# Patient Record
Sex: Female | Born: 1990 | Race: Black or African American | Hispanic: No | Marital: Single
Health system: Midwestern US, Community
[De-identification: ages and names within clinical notes are randomized; demographics above are authoritative.]

## PROBLEM LIST (undated history)

## (undated) DIAGNOSIS — A749 Chlamydial infection, unspecified: Secondary | ICD-10-CM

---
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
## 2011-02-25 ENCOUNTER — Other Ambulatory Visit: Payer: Self-pay | Admitting: Infectious Diseases

## 2011-02-25 ENCOUNTER — Ambulatory Visit
Admission: RE | Admit: 2011-02-25 | Discharge: 2011-02-25 | Disposition: A | Discharge status: Home / Self Care | Payer: PRIVATE HEALTH INSURANCE | Source: Ambulatory Visit | Attending: Infectious Diseases | Admitting: Infectious Diseases

## 2011-02-25 DIAGNOSIS — R7611 Nonspecific reaction to tuberculin skin test without active tuberculosis: Secondary | ICD-10-CM

## 2011-08-22 ENCOUNTER — Encounter (HOSPITAL_COMMUNITY): Payer: Self-pay | Admitting: Emergency Medicine

## 2011-08-22 ENCOUNTER — Emergency Department (HOSPITAL_COMMUNITY): Payer: PRIVATE HEALTH INSURANCE

## 2011-08-22 ENCOUNTER — Emergency Department (HOSPITAL_COMMUNITY)
Admission: EM | Admit: 2011-08-22 | Discharge: 2011-08-22 | Disposition: A | Discharge status: Home / Self Care | Payer: PRIVATE HEALTH INSURANCE | Attending: Emergency Medicine | Admitting: Emergency Medicine

## 2011-08-22 DIAGNOSIS — S39012A Strain of muscle, fascia and tendon of lower back, initial encounter: Secondary | ICD-10-CM

## 2011-08-22 DIAGNOSIS — Y92410 Unspecified street and highway as the place of occurrence of the external cause: Secondary | ICD-10-CM | POA: Insufficient documentation

## 2011-08-22 DIAGNOSIS — M542 Cervicalgia: Secondary | ICD-10-CM | POA: Insufficient documentation

## 2011-08-22 DIAGNOSIS — R079 Chest pain, unspecified: Secondary | ICD-10-CM | POA: Insufficient documentation

## 2011-08-22 DIAGNOSIS — R51 Headache: Secondary | ICD-10-CM | POA: Insufficient documentation

## 2011-08-22 DIAGNOSIS — M546 Pain in thoracic spine: Secondary | ICD-10-CM | POA: Insufficient documentation

## 2011-08-22 DIAGNOSIS — S20219A Contusion of unspecified front wall of thorax, initial encounter: Secondary | ICD-10-CM | POA: Insufficient documentation

## 2011-08-22 MED ORDER — HYDROCODONE-ACETAMINOPHEN 5-500 MG PO TABS: 1.0000 | ORAL_TABLET | Freq: Four times a day (QID) | ORAL | Status: AC | PRN

## 2011-08-22 MED ORDER — IBUPROFEN 800 MG PO TABS
800.0000 mg | ORAL_TABLET | Freq: Once | ORAL | Status: AC
  Administered 2011-08-22: 800 mg via ORAL
  Filled 2011-08-22: qty 1

## 2011-08-22 MED ORDER — MORPHINE SULFATE 4 MG/ML IJ SOLN: 4.0000 mg | Freq: Once | INTRAMUSCULAR | Status: DC

## 2011-08-22 MED ORDER — CYCLOBENZAPRINE HCL 10 MG PO TABS: 10.0000 mg | ORAL_TABLET | Freq: Three times a day (TID) | ORAL | Status: AC | PRN

## 2011-08-22 MED ORDER — NAPROXEN 500 MG PO TABS: 500.0000 mg | ORAL_TABLET | Freq: Two times a day (BID) | ORAL | Status: AC

## 2011-08-22 MED ORDER — OXYCODONE-ACETAMINOPHEN 5-325 MG PO TABS
2.0000 | ORAL_TABLET | Freq: Once | ORAL | Status: AC
  Administered 2011-08-22: 2 via ORAL
  Filled 2011-08-22: qty 2
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
## 2011-08-22 NOTE — Discharge Instructions (Signed)
Lumbosacral Strain Lumbosacral strain is one of the most common causes of back pain. There are many causes of back pain. Most are not serious conditions. CAUSES  Your backbone (spinal column) is made up of 24 main vertebral bodies, the sacrum, and the coccyx. These are held together by muscles and tough, fibrous tissue (ligaments). Nerve roots pass through the openings between the vertebrae. A sudden move or injury to the back may cause injury to, or pressure on, these nerves. This may result in localized back pain or pain movement (radiation) into the buttocks, down the leg, and into the foot. Sharp, shooting pain from the buttock down the back of the leg (sciatica) is frequently associated with a ruptured (herniated) disk. Pain may be caused by muscle spasm alone. Your caregiver can often find the cause of your pain by the details of your symptoms and an exam. In some cases, you may need tests (such as X-rays). Your caregiver will work with you to decide if any tests are needed based on your specific exam. HOME CARE INSTRUCTIONS   Avoid an underactive lifestyle. Active exercise, as directed by your caregiver, is your greatest weapon against back pain.   Avoid hard physical activities (tennis, racquetball, waterskiing) if you are not in proper physical condition for it. This may aggravate or create problems.   If you have a back problem, avoid sports requiring sudden body movements. Swimming and walking are generally safer activities.   Maintain good posture.   Avoid becoming overweight (obese).   Use bed rest for only the most extreme, sudden (acute) episode. Your caregiver will help you determine how much bed rest is necessary.   For acute conditions, you may put ice on the injured area.   Put ice in a plastic bag.   Place a towel between your skin and the bag.   Leave the ice on for 15 to 20 minutes at a time, every 2 hours, or as needed.   After you are improved and more active, it  may help to apply heat for 30 minutes before activities.  See your caregiver if you are having pain that lasts longer than expected. Your caregiver can advise appropriate exercises or therapy if needed. With conditioning, most back problems can be avoided. SEEK IMMEDIATE MEDICAL CARE IF:   You have numbness, tingling, weakness, or problems with the use of your arms or legs.   You experience severe back pain not relieved with medicines.   There is a change in bowel or bladder control.   You have increasing pain in any area of the body, including your belly (abdomen).   You notice shortness of breath, dizziness, or feel faint.   You feel sick to your stomach (nauseous), are throwing up (vomiting), or become sweaty.   You notice discoloration of your toes or legs, or your feet get very cold.   Your back pain is getting worse.   You have a fever.  MAKE SURE YOU:   Understand these instructions.   Will watch your condition.   Will get help right away if you are not doing well or get worse.  Document Released: 12/11/2004 Document Revised: 02/20/2011 Document Reviewed: 06/02/2008 [facility] Patient Information 2012 [place], [place].  Chest Contusion You have been checked for injuries to your chest. Your caregiver has not found injuries serious enough to require hospitalization. It is common to have bruises and sore muscles after an injury. These tend to feel worse the first 24 hours. You may gradually  develop more stiffness and soreness over the next several hours to several days. This usually feels worse the first morning following your injury. After a few days, you will usually begin to improve. The amount of improvement depends on the amount of damage. Following the accident, if the pain in any area continues to increase or you develop new areas of pain, you should see your primary caregiver or return to the Emergency Department for re-evaluation. HOME CARE INSTRUCTIONS   Put ice on  sore areas every 2 hours for 20 minutes while awake for the next 2 days.   Drink extra fluids. Do not drink alcohol.   Activity as tolerated. Lifting may make pain worse.   Only take over-the-counter or prescription medicines for pain, discomfort, or fever as directed by your caregiver. Do not use aspirin. This may increase bruising or increase bleeding.  SEEK IMMEDIATE MEDICAL CARE IF:   There is a worsening of any of the problems that brought you in for care.   Shortness of breath, dizziness or fainting develop.   You have chest pain, difficulty breathing, or develop pain going down the left arm or up into jaw.   You feel sick to your stomach (nausea), vomiting or sweats.   You have increasing belly (abdominal) discomfort.   There is blood in your urine, stool, or if you vomit blood.   There is pain in either shoulder in an area where a shoulder strap would be.   You have feelings of lightheadedness, or if you should have a fainting episode.   You have numbness, tingling, weakness, or problems with the use of your arms or legs.   Severe headaches not relieved with medications develop.   You have a change in bowel or bladder control.   There is increasing pain in any areas of the body.  If you feel your symptoms are worsening, and you are not able to see your primary caregiver, return to the Emergency Department immediately. MAKE SURE YOU:   Understand these instructions.   Will watch your condition.   Will get help right away if you are not doing well or get worse.  Document Released: 11/26/2000 Document Revised: 02/20/2011 Document Reviewed: 10/20/2007 [facility] Patient Information 2012 [place], [place].
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
## 2011-08-22 NOTE — ED Notes (Signed)
Pt refused IV  
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
## 2014-04-16 ENCOUNTER — Emergency Department (INDEPENDENT_AMBULATORY_CARE_PROVIDER_SITE_OTHER)
Admission: EM | Admit: 2014-04-16 | Discharge: 2014-04-16 | Disposition: A | Discharge status: Home / Self Care | Payer: Self-pay | Source: Home / Self Care | Attending: Family Medicine | Admitting: Family Medicine

## 2014-04-16 ENCOUNTER — Encounter (HOSPITAL_COMMUNITY): Payer: Self-pay | Admitting: *Deleted

## 2014-04-16 ENCOUNTER — Other Ambulatory Visit (HOSPITAL_COMMUNITY)
Admission: RE | Admit: 2014-04-16 | Discharge: 2014-04-16 | Disposition: A | Discharge status: Home / Self Care | Payer: Self-pay | Source: Ambulatory Visit | Attending: Family Medicine | Admitting: Family Medicine

## 2014-04-16 DIAGNOSIS — Z113 Encounter for screening for infections with a predominantly sexual mode of transmission: Secondary | ICD-10-CM | POA: Insufficient documentation

## 2014-04-16 DIAGNOSIS — N760 Acute vaginitis: Secondary | ICD-10-CM

## 2014-04-16 LAB — POCT URINALYSIS DIP (DEVICE)
Bilirubin Urine: NEGATIVE
Glucose, UA: NEGATIVE mg/dL
HGB URINE DIPSTICK: NEGATIVE
Nitrite: POSITIVE — AB
PROTEIN: NEGATIVE mg/dL
SPECIFIC GRAVITY, URINE: 1.02 (ref 1.005–1.030)
UROBILINOGEN UA: 1 mg/dL (ref 0.0–1.0)
pH: 7.5 (ref 5.0–8.0)

## 2014-04-16 LAB — POCT PREGNANCY, URINE: Preg Test, Ur: NEGATIVE

## 2014-04-16 MED ORDER — CEFTRIAXONE SODIUM 250 MG IJ SOLR
250.0000 mg | Freq: Once | INTRAMUSCULAR | Status: AC
  Administered 2014-04-16: 250 mg via INTRAMUSCULAR

## 2014-04-16 MED ORDER — AZITHROMYCIN 250 MG PO TABS
ORAL_TABLET | ORAL | Status: AC
  Filled 2014-04-16: qty 4

## 2014-04-16 MED ORDER — METRONIDAZOLE 500 MG PO TABS: 500.0000 mg | ORAL_TABLET | Freq: Two times a day (BID) | ORAL | Status: DC

## 2014-04-16 MED ORDER — AZITHROMYCIN 250 MG PO TABS
1000.0000 mg | ORAL_TABLET | Freq: Once | ORAL | Status: AC
  Administered 2014-04-16: 1000 mg via ORAL

## 2014-04-16 MED ORDER — LIDOCAINE HCL (PF) 1 % IJ SOLN
INTRAMUSCULAR | Status: AC
Start: 2014-04-16 — End: 2014-04-16
  Filled 2014-04-16: qty 5

## 2014-04-16 MED ORDER — CEFTRIAXONE SODIUM 250 MG IJ SOLR
INTRAMUSCULAR | Status: AC
  Filled 2014-04-16: qty 250

## 2014-04-16 NOTE — Discharge Instructions (Signed)
We will call with positive test results and treat as indicated  °

## 2014-04-16 NOTE — ED Provider Notes (Signed)
CSN: [id number]     Arrival date & time 04/16/14  1701 History   First MD Initiated Contact with Patient 04/16/14 1724     Chief Complaint  Patient presents with  . Vaginal Discharge   (Consider location/radiation/quality/duration/timing/severity/associated sxs/prior Treatment) Patient is a 24 y.o. female presenting with vaginal discharge. The history is provided by the patient.  Vaginal Discharge Quality:  Yellow and thin Severity:  Moderate Onset quality:  Gradual Duration:  1 month Progression:  Unchanged Chronicity:  New Context comment:  Last intercourse was 12/25., no birth control. Associated symptoms: no fever     History reviewed. No pertinent past medical history. History reviewed. No pertinent past surgical history. History reviewed. No pertinent family history. History  Substance Use Topics  . Smoking status: Not on file  . Smokeless tobacco: Not on file  . Alcohol Use: No   OB History    No data available     Review of Systems  Constitutional: Negative.  Negative for fever.  Gastrointestinal: Negative.   Genitourinary: Positive for vaginal discharge and menstrual problem. Negative for vaginal bleeding, genital sores and pelvic pain.    Allergies  Review of patient's allergies indicates no known allergies.  Home Medications   Prior to Admission medications   Medication Sig Start Date End Date Taking? Authorizing Provider  ibuprofen (ADVIL,MOTRIN) 200 MG tablet Take 400 mg by mouth every 8 (eight) hours as needed. For pain.    Historical Provider, MD  metroNIDAZOLE (FLAGYL) 500 MG tablet Take 1 tablet (500 mg total) by mouth 2 (two) times daily. 04/16/14   Linna HoffJames D Garrit Marrow, MD   BP 121/75 mmHg  Pulse 84  Temp(Src) 99.9 F (37.7 C) (Oral)  Resp 16  SpO2 100%  LMP 03/31/2014 Physical Exam  Constitutional: She is oriented to person, place, and time. She appears well-developed and well-nourished.  Genitourinary: Uterus normal. Uterus is not enlarged and  not tender. Cervix exhibits discharge. Cervix exhibits no motion tenderness. Right adnexum displays no mass and no tenderness. Left adnexum displays no mass and no tenderness. No erythema or tenderness in the vagina. Vaginal discharge found.  Neurological: She is alert and oriented to person, place, and time.  Skin: Skin is warm and dry.  Nursing note and vitals reviewed.   ED Course  Procedures (including critical care time) Labs Review Labs Reviewed  POCT URINALYSIS DIP (DEVICE) - Abnormal; Notable for the following:    Ketones, ur TRACE (*)    Nitrite POSITIVE (*)    Leukocytes, UA LARGE (*)    All other components within normal limits  HIV ANTIBODY (ROUTINE TESTING)  POCT PREGNANCY, URINE  CERVICOVAGINAL ANCILLARY ONLY    Imaging Review No results found.   MDM   1. Vaginitis        Linna HoffJames D Grabiel Schmutz, MD 04/16/14 1754

## 2014-04-16 NOTE — ED Notes (Signed)
Pt  Has  Symptoms  Of  Vaginal  Discharge      For  Over  1  Month   She  Ambulates upright  With a  Steady  Fluid  Gait     She  Appears  In no  Acute  Distress       Speaking in  Complete  sentances      Skin is  Warm  And  Dry

## 2014-04-17 LAB — CERVICOVAGINAL ANCILLARY ONLY
CHLAMYDIA, DNA PROBE: POSITIVE — AB
NEISSERIA GONORRHEA: NEGATIVE
Wet Prep (BD Affirm): NEGATIVE
Wet Prep (BD Affirm): POSITIVE — AB
Wet Prep (BD Affirm): POSITIVE — AB

## 2014-04-18 ENCOUNTER — Telehealth (HOSPITAL_COMMUNITY): Payer: Self-pay | Admitting: *Deleted

## 2014-04-18 LAB — HIV ANTIBODY (ROUTINE TESTING W REFLEX): HIV Screen 4th Generation wRfx: NONREACTIVE

## 2014-04-18 NOTE — ED Notes (Addendum)
GC neg., Chlamydia pos., Affirm: Candida neg., Gardnerella and Trich pos., HIV pending.  Pt. adequately treated with Zithromax and Flagyl.  I called pt. and left a message to call.  Call 1. 04/18/2014 HIV non-reactive.  Pt. called back today while I was in staffing.  Registration told her I would call back later.  I called pt. Pt. verified x 2 and given results.  Pt. told she was adequately treated for Chlamydia with the Zithromax and the bacterial vaginosis and Trich with the Flagyl.  Pt. instructed to notify her partner to get tested and treated for Chlamydia and Trich.  No sex until she has finished her medicine and her partner has been treated and to practice safe sex.  Pt. [facility].  Pt. said she could see the results on My Chart and thanked me for explaining everything to her. Vassie MoselleYork, Szymon Foiles M 04/19/2014

## 2014-05-27 ENCOUNTER — Encounter (HOSPITAL_COMMUNITY): Payer: Self-pay | Admitting: Emergency Medicine

## 2014-05-27 ENCOUNTER — Emergency Department (INDEPENDENT_AMBULATORY_CARE_PROVIDER_SITE_OTHER)
Admission: EM | Admit: 2014-05-27 | Discharge: 2014-05-27 | Disposition: A | Discharge status: Home / Self Care | Payer: Self-pay | Source: Home / Self Care | Attending: Family Medicine | Admitting: Family Medicine

## 2014-05-27 ENCOUNTER — Other Ambulatory Visit (HOSPITAL_COMMUNITY)
Admission: RE | Admit: 2014-05-27 | Discharge: 2014-05-27 | Disposition: A | Discharge status: Home / Self Care | Payer: Self-pay | Source: Ambulatory Visit | Attending: Family Medicine | Admitting: Family Medicine

## 2014-05-27 DIAGNOSIS — N760 Acute vaginitis: Secondary | ICD-10-CM

## 2014-05-27 DIAGNOSIS — Z113 Encounter for screening for infections with a predominantly sexual mode of transmission: Secondary | ICD-10-CM | POA: Insufficient documentation

## 2014-05-27 LAB — POCT PREGNANCY, URINE: Preg Test, Ur: NEGATIVE

## 2014-05-27 LAB — POCT URINALYSIS DIP (DEVICE)
BILIRUBIN URINE: NEGATIVE
Glucose, UA: NEGATIVE mg/dL
HGB URINE DIPSTICK: NEGATIVE
Ketones, ur: NEGATIVE mg/dL
Nitrite: NEGATIVE
PROTEIN: 30 mg/dL — AB
Specific Gravity, Urine: 1.015 (ref 1.005–1.030)
Urobilinogen, UA: 1 mg/dL (ref 0.0–1.0)
pH: 8.5 — ABNORMAL HIGH (ref 5.0–8.0)

## 2014-05-27 MED ORDER — CEFTRIAXONE SODIUM 250 MG IJ SOLR
250.0000 mg | Freq: Once | INTRAMUSCULAR | Status: AC
  Administered 2014-05-27: 250 mg via INTRAMUSCULAR

## 2014-05-27 MED ORDER — LIDOCAINE HCL (PF) 1 % IJ SOLN
INTRAMUSCULAR | Status: AC
  Filled 2014-05-27: qty 5

## 2014-05-27 MED ORDER — CEFTRIAXONE SODIUM 250 MG IJ SOLR
INTRAMUSCULAR | Status: AC
  Filled 2014-05-27: qty 250

## 2014-05-27 MED ORDER — AZITHROMYCIN 250 MG PO TABS
ORAL_TABLET | ORAL | Status: AC
  Filled 2014-05-27: qty 4

## 2014-05-27 MED ORDER — AZITHROMYCIN 250 MG PO TABS
1000.0000 mg | ORAL_TABLET | Freq: Once | ORAL | Status: AC
  Administered 2014-05-27: 1000 mg via ORAL

## 2014-05-27 NOTE — Discharge Instructions (Signed)
No sex x 2 weeks. Please use condoms. If no improvement, please follow up at health dept or obgyn provider of your choice. Sexually Transmitted Disease A sexually transmitted disease (STD) is a disease or infection that may be passed (transmitted) from person to person, usually during sexual activity. This may happen by way of saliva, semen, blood, vaginal mucus, or urine. Common STDs include:   Gonorrhea.   Chlamydia.   Syphilis.   HIV and AIDS.   Genital herpes.   Hepatitis B and C.   Trichomonas.   Human papillomavirus (HPV).   Pubic lice.   Scabies.  Mites.  Bacterial vaginosis. WHAT ARE CAUSES OF STDs? An STD may be caused by bacteria, a virus, or parasites. STDs are often transmitted during sexual activity if one person is infected. However, they may also be transmitted through nonsexual means. STDs may be transmitted after:   Sexual intercourse with an infected person.   Sharing sex toys with an infected person.   Sharing needles with an infected person or using unclean piercing or tattoo needles.  Having intimate contact with the genitals, mouth, or rectal areas of an infected person.   Exposure to infected fluids during birth. WHAT ARE THE SIGNS AND SYMPTOMS OF STDs? Different STDs have different symptoms. Some people may not have any symptoms. If symptoms are present, they may include:   Painful or bloody urination.   Pain in the pelvis, abdomen, vagina, anus, throat, or eyes.   A skin rash, itching, or irritation.  Growths, ulcerations, blisters, or sores in the genital and anal areas.  Abnormal vaginal discharge with or without bad odor.   Penile discharge in men.   Fever.   Pain or bleeding during sexual intercourse.   Swollen glands in the groin area.   Yellow skin and eyes (jaundice). This is seen with hepatitis.   Swollen testicles.  Infertility.  Sores and blisters in the mouth. HOW ARE STDs DIAGNOSED? To make  a diagnosis, your health care provider may:   Take a medical history.   Perform a physical exam.   Take a sample of any discharge to examine.  Swab the throat, cervix, opening to the penis, rectum, or vagina for testing.  Test a sample of your first morning urine.   Perform blood tests.   Perform a Pap test, if this applies.   Perform a colposcopy.   Perform a laparoscopy.  HOW ARE STDs TREATED? Treatment depends on the STD. Some STDs may be treated but not cured.   Chlamydia, gonorrhea, trichomonas, and syphilis can be cured with antibiotic medicine.   Genital herpes, hepatitis, and HIV can be treated, but not cured, with prescribed medicines. The medicines lessen symptoms.   Genital warts from HPV can be treated with medicine or by freezing, burning (electrocautery), or surgery. Warts may come back.   HPV cannot be cured with medicine or surgery. However, abnormal areas may be removed from the cervix, vagina, or vulva.   If your diagnosis is confirmed, your recent sexual partners need treatment. This is true even if they are symptom-free or have a negative culture or evaluation. They should not have sex until their health care providers say it is okay. HOW CAN I REDUCE MY RISK OF GETTING AN STD? Take these steps to reduce your risk of getting an STD:  Use latex condoms, dental dams, and water-soluble lubricants during sexual activity. Do not use petroleum jelly or oils.  Avoid having multiple sex partners.  Do not have  sex with someone who has other sex partners.  Do not have sex with anyone you do not know or who is at high risk for an STD.  Avoid risky sex practices that can break your skin.  Do not have sex if you have open sores on your mouth or skin.  Avoid drinking too much alcohol or taking illegal drugs. Alcohol and drugs can affect your judgment and put you in a vulnerable position.  Avoid engaging in oral and anal sex acts.  Get vaccinated for  HPV and hepatitis. If you have not received these vaccines in the past, talk to your health care provider about whether one or both might be right for you.   If you are at risk of being infected with HIV, it is recommended that you take a prescription medicine daily to prevent HIV infection. This is called pre-exposure prophylaxis (PrEP). You are considered at risk if:  You are a man who has sex with other men (MSM).  You are a heterosexual man or woman and are sexually active with more than one partner.  You take drugs by injection.  You are sexually active with a partner who has HIV.  Talk with your health care provider about whether you are at high risk of being infected with HIV. If you choose to begin PrEP, you should first be tested for HIV. You should then be tested every 3 months for as long as you are taking PrEP.  WHAT SHOULD I DO IF I THINK I HAVE AN STD?  See your health care provider.   Tell your sexual partner(s). They should be tested and treated for any STDs.  Do not have sex until your health care provider says it is okay. WHEN SHOULD I GET IMMEDIATE MEDICAL CARE? Contact your health care provider right away if:   You have severe abdominal pain.  You are a man and notice swelling or pain in your testicles.  You are a woman and notice swelling or pain in your vagina. Document Released: 05/24/2002 Document Revised: 03/08/2013 Document Reviewed: 09/21/2012 [facility] Patient Information 2015 [place], [place]. This information is not intended to replace advice given to you by your health care provider. Make sure you discuss any questions you have with your health care provider.  Vaginitis Vaginitis is an inflammation of the vagina. It is most often caused by a change in the normal balance of the bacteria and yeast that live in the vagina. This change in balance causes an overgrowth of certain bacteria or yeast, which causes the inflammation. There are different types of  vaginitis, but the most common types are:  Bacterial vaginosis.  Yeast infection (candidiasis).  Trichomoniasis vaginitis. This is a sexually transmitted infection (STI).  Viral vaginitis.  Atropic vaginitis.  Allergic vaginitis. CAUSES  The cause depends on the type of vaginitis. Vaginitis can be caused by:  Bacteria (bacterial vaginosis).  Yeast (yeast infection).  A parasite (trichomoniasis vaginitis)  A virus (viral vaginitis).  Low hormone levels (atrophic vaginitis). Low hormone levels can occur during pregnancy, breastfeeding, or after menopause.  Irritants, such as bubble baths, scented tampons, and feminine sprays (allergic vaginitis). Other factors can change the normal balance of the yeast and bacteria that live in the vagina. These include:  Antibiotic medicines.  Poor hygiene.  Diaphragms, vaginal sponges, spermicides, birth control pills, and intrauterine devices (IUD).  Sexual intercourse.  Infection.  Uncontrolled diabetes.  A weakened immune system. SYMPTOMS  Symptoms can vary depending on the cause of  the vaginitis. Common symptoms include:  Abnormal vaginal discharge.  The discharge is white, gray, or yellow with bacterial vaginosis.  The discharge is thick, white, and cheesy with a yeast infection.  The discharge is frothy and yellow or greenish with trichomoniasis.  A bad vaginal odor.  The odor is fishy with bacterial vaginosis.  Vaginal itching, pain, or swelling.  Painful intercourse.  Pain or burning when urinating. Sometimes, there are no symptoms. TREATMENT  Treatment will vary depending on the type of infection.   Bacterial vaginosis and trichomoniasis are often treated with antibiotic creams or pills.  Yeast infections are often treated with antifungal medicines, such as vaginal creams or suppositories.  Viral vaginitis has no cure, but symptoms can be treated with medicines that relieve discomfort. Your sexual partner  should be treated as well.  Atrophic vaginitis may be treated with an estrogen cream, pill, suppository, or vaginal ring. If vaginal dryness occurs, lubricants and moisturizing creams may help. You may be told to avoid scented soaps, sprays, or douches.  Allergic vaginitis treatment involves quitting the use of the product that is causing the problem. Vaginal creams can be used to treat the symptoms. HOME CARE INSTRUCTIONS   Take all medicines as directed by your caregiver.  Keep your genital area clean and dry. Avoid soap and only rinse the area with water.  Avoid douching. It can remove the healthy bacteria in the vagina.  Do not use tampons or have sexual intercourse until your vaginitis has been treated. Use sanitary pads while you have vaginitis.  Wipe from front to back. This avoids the spread of bacteria from the rectum to the vagina.  Let air reach your genital area.  Wear cotton underwear to decrease moisture buildup.  Avoid wearing underwear while you sleep until your vaginitis is gone.  Avoid tight pants and underwear or nylons without a cotton panel.  Take off wet clothing (especially bathing suits) as soon as possible.  Use mild, non-scented products. Avoid using irritants, such as:  Scented feminine sprays.  Fabric softeners.  Scented detergents.  Scented tampons.  Scented soaps or bubble baths.  Practice safe sex and use condoms. Condoms may prevent the spread of trichomoniasis and viral vaginitis. SEEK MEDICAL CARE IF:   You have abdominal pain.  You have a fever or persistent symptoms for more than 2-3 days.  You have a fever and your symptoms suddenly get worse. Document Released: 12/29/2006 Document Revised: 11/26/2011 Document Reviewed: 08/14/2011 [facility] Patient Information 2015 [place]. This information is not intended to replace advice given to you by your health care provider. Make sure you discuss any questions you have with your  health care provider.

## 2014-05-27 NOTE — ED Notes (Signed)
C/o vaginal discharge with irritation and mild pelvic/abdominal discomfort.   Discharge is sometimes thick/white and yellow.  Denies fever, n/v/d.

## 2014-05-27 NOTE — ED Provider Notes (Signed)
CSN: [id number]     Arrival date & time 05/27/14  1129 History   First MD Initiated Contact with Patient 05/27/14 1245     Chief Complaint  Patient presents with  . Vaginal Discharge   (Consider location/radiation/quality/duration/timing/severity/associated sxs/prior Treatment) Patient is a 24 y.o. female presenting with vaginal discharge. The history is provided by the patient.  Vaginal Discharge Quality:  Malodorous and green Onset quality:  Gradual Duration:  3 days Timing:  Constant Chronicity:  New Context: after intercourse   Relieved by:  None tried Ineffective treatments:  None tried Risk factors: unprotected sex   Risk factors comment:  Concerned about infidelity and possible STI exposure   History reviewed. No pertinent past medical history. History reviewed. No pertinent past surgical history. History reviewed. No pertinent family history. History  Substance Use Topics  . Smoking status: Never Smoker   . Smokeless tobacco: Not on file  . Alcohol Use: No   OB History    No data available     Review of Systems  Genitourinary: Positive for vaginal discharge.  All other systems reviewed and are negative.   Allergies  Review of patient's allergies indicates no known allergies.  Home Medications   Prior to Admission medications   Medication Sig Start Date End Date Taking? Authorizing Provider  ibuprofen (ADVIL,MOTRIN) 200 MG tablet Take 400 mg by mouth every 8 (eight) hours as needed. For pain.    Historical Provider, MD  metroNIDAZOLE (FLAGYL) 500 MG tablet Take 1 tablet (500 mg total) by mouth 2 (two) times daily. 04/16/14   Linna HoffJames D Kindl, MD   BP 98/69 mmHg  Pulse 76  Temp(Src) 98.5 F (36.9 C) (Oral)  Resp 12  SpO2 99%  LMP 05/25/2014 Physical Exam  Constitutional: She is oriented to person, place, and time. She appears well-developed and well-nourished. No distress.  HENT:  Head: Normocephalic and atraumatic.  Eyes: Conjunctivae are normal. No  scleral icterus.  Cardiovascular: Normal rate.   Pulmonary/Chest: Effort normal.  Genitourinary: Uterus normal. Pelvic exam was performed with patient supine. There is no rash, tenderness or lesion on the right labia. There is no rash, tenderness or lesion on the left labia. Cervix exhibits discharge. Cervix exhibits no motion tenderness and no friability. Right adnexum displays no mass, no tenderness and no fullness. Left adnexum displays no mass, no tenderness and no fullness. No erythema, tenderness or bleeding in the vagina. No foreign body around the vagina. No signs of injury around the vagina. Vaginal discharge found.  Copious yellow green thin vaginal discharge  Musculoskeletal: Normal range of motion.  Neurological: She is alert and oriented to person, place, and time.  Skin: Skin is warm and dry.  Psychiatric: She has a normal mood and affect. Her behavior is normal.  Nursing note and vitals reviewed.   ED Course  Procedures (including critical care time) Labs Review Labs Reviewed  POCT URINALYSIS DIP (DEVICE) - Abnormal; Notable for the following:    pH 8.5 (*)    Protein, ur 30 (*)    Leukocytes, UA LARGE (*)    All other components within normal limits  HIV ANTIBODY (ROUTINE TESTING)  RPR  POCT PREGNANCY, URINE  CERVICOVAGINAL ANCILLARY ONLY    Imaging Review No results found.   MDM   1. Vaginitis   Cervicovaginal and serology testing for STIs pending Patient treated empirically for gonorrhea and chlamydia with ceftriaxone 250 mg IM and [facility] Advised regarding safe sex practices No sex  x 2 weeks Wear condoms Follow up at [facility] if no improvement.    Ria Clock, [place] 05/27/14 1335

## 2014-05-28 LAB — HIV ANTIBODY (ROUTINE TESTING W REFLEX): HIV Screen 4th Generation wRfx: NONREACTIVE

## 2014-05-28 LAB — RPR: RPR: NONREACTIVE

## 2014-05-29 LAB — CERVICOVAGINAL ANCILLARY ONLY
CHLAMYDIA, DNA PROBE: NEGATIVE
NEISSERIA GONORRHEA: NEGATIVE
WET PREP (BD AFFIRM): NEGATIVE
WET PREP (BD AFFIRM): POSITIVE — AB
WET PREP (BD AFFIRM): POSITIVE — AB

## 2014-05-29 MED ORDER — METRONIDAZOLE 500 MG PO TABS: 500.0000 mg | ORAL_TABLET | Freq: Two times a day (BID) | ORAL | Status: DC

## 2014-05-29 NOTE — Progress Notes (Signed)
05/29/2014: Patient's lab results consistent with BV and trichomonas infection. Contacted patient by phone and verified identity and results discussed. 7 day course of Flagyl sent electronically to patient's pharmacy [facility] [address]) and patient made aware of safe sex practices, need to notify partner, and no sex x 2 weeks and only if symptoms resolved. Voices understanding.

## 2014-05-31 ENCOUNTER — Telehealth (HOSPITAL_COMMUNITY): Payer: Self-pay | Admitting: *Deleted

## 2014-05-31 NOTE — ED Notes (Signed)
GC/Chlamydia neg., Affirm: Candida neg., Gardnerella and Trich pos., HIV/RPR non-reactive.  I called pt. Pt. verified x 2 and given results.  Pt. told she was adequately treated with Flagyl.  Instructed to notify her partner to be treated with Flagyl, no sex until she has finished her medication and her partner has been treated and to practice safe sex. Pt. told she can get HIV [facility] by appointment. After talking to pt., I noted that Narda BondsLee Presson PA had already notified her on 3/14.  Pt. did not tell me she had already received her results. 05/31/2014

## 2014-08-06 ENCOUNTER — Encounter (HOSPITAL_COMMUNITY): Payer: Self-pay | Admitting: Emergency Medicine

## 2014-08-06 ENCOUNTER — Emergency Department (HOSPITAL_COMMUNITY)
Admission: EM | Admit: 2014-08-06 | Discharge: 2014-08-06 | Disposition: A | Discharge status: Home / Self Care | Payer: Self-pay | Attending: Emergency Medicine | Admitting: Emergency Medicine

## 2014-08-06 DIAGNOSIS — Z8619 Personal history of other infectious and parasitic diseases: Secondary | ICD-10-CM | POA: Insufficient documentation

## 2014-08-06 DIAGNOSIS — N898 Other specified noninflammatory disorders of vagina: Secondary | ICD-10-CM | POA: Insufficient documentation

## 2014-08-06 DIAGNOSIS — Z3202 Encounter for pregnancy test, result negative: Secondary | ICD-10-CM | POA: Insufficient documentation

## 2014-08-06 HISTORY — DX: Chlamydial infection, unspecified: A74.9

## 2014-08-06 LAB — POC URINE PREG, ED: Preg Test, Ur: NEGATIVE

## 2014-08-06 LAB — URINALYSIS, ROUTINE W REFLEX MICROSCOPIC
Glucose, UA: NEGATIVE mg/dL
Hgb urine dipstick: NEGATIVE
Ketones, ur: 15 mg/dL — AB
Nitrite: NEGATIVE
Protein, ur: NEGATIVE mg/dL
Specific Gravity, Urine: 1.028 (ref 1.005–1.030)
Urobilinogen, UA: 0.2 mg/dL (ref 0.0–1.0)
pH: 5.5 (ref 5.0–8.0)

## 2014-08-06 LAB — URINE MICROSCOPIC-ADD ON

## 2014-08-06 MED ORDER — LIDOCAINE HCL (PF) 1 % IJ SOLN
0.9000 mL | Freq: Once | INTRAMUSCULAR | Status: AC
  Administered 2014-08-06: 0.9 mL via INTRADERMAL
  Filled 2014-08-06: qty 5

## 2014-08-06 MED ORDER — CEFTRIAXONE SODIUM 250 MG IJ SOLR
250.0000 mg | Freq: Once | INTRAMUSCULAR | Status: AC
  Administered 2014-08-06: 250 mg via INTRAMUSCULAR
  Filled 2014-08-06: qty 250

## 2014-08-06 MED ORDER — AZITHROMYCIN 250 MG PO TABS
1000.0000 mg | ORAL_TABLET | Freq: Once | ORAL | Status: AC
  Administered 2014-08-06: 1000 mg via ORAL
  Filled 2014-08-06: qty 4

## 2014-08-06 MED ORDER — ONDANSETRON 4 MG PO TBDP
4.0000 mg | ORAL_TABLET | Freq: Once | ORAL | Status: AC
  Administered 2014-08-06: 4 mg via ORAL
  Filled 2014-08-06: qty 1

## 2014-08-06 NOTE — ED Notes (Signed)
Pt comes in c/o vaginal itching and discharge after sexual intercourse yesterday.  Denies difficulty urinating or burning while urinating.  Denies N/V/D, fevers, chills.

## 2014-08-06 NOTE — ED Notes (Signed)
PA at bedside.

## 2014-08-06 NOTE — ED Notes (Signed)
Patient c/o nausea, feeling hot and sweating. Patient's heart rate has gone up.  Zofran ordered and PA notified.

## 2014-08-06 NOTE — Discharge Instructions (Signed)
Return here as needed. Follow up with the Health Department as needed.

## 2014-08-06 NOTE — ED Provider Notes (Signed)
CSN: [id number]     Arrival date & time 08/06/14  4098 History   First MD Initiated Contact with Patient 08/06/14 [phone]     Chief Complaint  Patient presents with  . Vaginal Itching  . Vaginal Discharge     (Consider location/radiation/quality/duration/timing/severity/associated sxs/prior Treatment) HPI Patient presents to the emergency department with vaginal discharge and itching.  The patient states that she noticed she had discharge after intercourse yesterday.  The patient states she does not have any urinary symptoms.  Patient denies nausea, vomiting, diarrhea, abdominal pain, weakness, dizziness, fever, back pain, incontinence, hematuria, or syncope.  The patient states that she did not take any medications prior to arrival.  She states she has a history of chlamydia Past Medical History  Diagnosis Date  . Chlamydia    History reviewed. No pertinent past surgical history. History reviewed. No pertinent family history. History  Substance Use Topics  . Smoking status: Never Smoker   . Smokeless tobacco: Not on file  . Alcohol Use: No   OB History    No data available     Review of Systems  All other systems negative except as documented in the HPI. All pertinent positives and negatives as reviewed in the HPI.  Allergies  Review of patient's allergies indicates no known allergies.  Home Medications   Prior to Admission medications   Medication Sig Start Date End Date Taking? Authorizing Provider  ibuprofen (ADVIL,MOTRIN) 200 MG tablet Take 400 mg by mouth every 8 (eight) hours as needed. For pain.   Yes Historical Provider, MD  OVER THE COUNTER MEDICATION Take 1 tablet by mouth daily. Sinus & Congestion medication   Yes Historical Provider, MD   BP 118/75 mmHg  Pulse 105  Temp(Src) 98.2 F (36.8 C) (Oral)  Resp 20  SpO2 99%  LMP  (LMP Unknown) Physical Exam  Constitutional: She is oriented to person, place, and time. She appears well-developed and well-nourished.  No distress.  HENT:  Head: Normocephalic and atraumatic.  Mouth/Throat: Oropharynx is clear and moist.  Eyes: Pupils are equal, round, and reactive to light.  Neck: Normal range of motion. Neck supple.  Cardiovascular: Regular rhythm and normal heart sounds.  Exam reveals no gallop and no friction rub.   No murmur heard. Pulmonary/Chest: Effort normal and breath sounds normal.  Genitourinary: Cervix exhibits discharge. Cervix exhibits no motion tenderness and no friability. Right adnexum displays no mass and no tenderness. Left adnexum displays no mass and no tenderness. No erythema, tenderness or bleeding in the vagina. No foreign body around the vagina. No signs of injury around the vagina. Vaginal discharge found.  Chaperone was present during the exam  Neurological: She is alert and oriented to person, place, and time. She exhibits normal muscle tone. Coordination normal.  Skin: Skin is warm and dry. No rash noted. No erythema.  Nursing note and vitals reviewed.   ED Course  Procedures (including critical care time) Labs Review Labs Reviewed  URINALYSIS, ROUTINE W REFLEX MICROSCOPIC - Abnormal; Notable for the following:    APPearance CLOUDY (*)    Bilirubin Urine SMALL (*)    Ketones, ur 15 (*)    Leukocytes, UA SMALL (*)    All other components within normal limits  URINE MICROSCOPIC-ADD ON - Abnormal; Notable for the following:    Squamous Epithelial / LPF MANY (*)    All other components within normal limits  RPR  POC URINE PREG, ED  GC/CHLAMYDIA PROBE AMP ([place])   The  patient will be treated for sexually transmitted diseases.  She is advised follow-up with the health department as needed    Charlestine NightChristopher Ailah Barna, PA-C 08/06/14 1619  Nelva Nayobert Beaton, MD 08/07/14 [phone]

## 2014-08-07 LAB — GC/CHLAMYDIA PROBE AMP (~~LOC~~) NOT AT ARMC
Chlamydia: NEGATIVE
Neisseria Gonorrhea: NEGATIVE

## 2014-08-07 LAB — RPR: RPR Ser Ql: NONREACTIVE

## 2015-03-07 ENCOUNTER — Encounter (HOSPITAL_COMMUNITY): Payer: Self-pay | Admitting: *Deleted

## 2015-03-07 ENCOUNTER — Emergency Department (INDEPENDENT_AMBULATORY_CARE_PROVIDER_SITE_OTHER)
Admission: EM | Admit: 2015-03-07 | Discharge: 2015-03-07 | Disposition: A | Discharge status: Home / Self Care | Payer: Self-pay | Source: Home / Self Care

## 2015-03-07 DIAGNOSIS — R110 Nausea: Secondary | ICD-10-CM

## 2015-03-07 LAB — POCT URINALYSIS DIP (DEVICE)
GLUCOSE, UA: NEGATIVE mg/dL
Hgb urine dipstick: NEGATIVE
Ketones, ur: 80 mg/dL — AB
Leukocytes, UA: NEGATIVE
Nitrite: NEGATIVE
Protein, ur: NEGATIVE mg/dL
SPECIFIC GRAVITY, URINE: 1.025 (ref 1.005–1.030)
UROBILINOGEN UA: 1 mg/dL (ref 0.0–1.0)
pH: 6.5 (ref 5.0–8.0)

## 2015-03-07 LAB — POCT PREGNANCY, URINE: PREG TEST UR: NEGATIVE

## 2015-03-07 MED ORDER — PROMETHAZINE HCL 25 MG PO TABS: 25.0000 mg | ORAL_TABLET | Freq: Four times a day (QID) | ORAL | Status: DC | PRN

## 2015-03-07 NOTE — ED Notes (Signed)
Pt  Reports  Symptoms  Of  Nausea      Irregular periods           Lightheaded          X      4  Weeks             Pt  Appears  In no  Acute     Distress

## 2015-03-07 NOTE — Discharge Instructions (Signed)
Use medicine as needed and see your gynecologist or women's hosp if further menstrual problems.

## 2015-03-07 NOTE — ED Provider Notes (Signed)
CSN: [id number]     Arrival date & time 03/07/15  1657 History   None    Chief Complaint  Patient presents with  . Abdominal Pain   (Consider location/radiation/quality/duration/timing/severity/associated sxs/prior Treatment) Patient is a 24 y.o. female presenting with abdominal pain. The history is provided by the patient.  Abdominal Pain Pain location:  Epigastric Pain quality: fullness   Pain radiates to:  Does not radiate Pain severity:  Mild Onset quality:  Gradual Duration:  3 days Progression:  Unchanged Chronicity:  New Context: not sick contacts and not suspicious food intake   Context comment:  Possible preg as abnl menses without birth control. Associated symptoms: nausea   Associated symptoms: no diarrhea, no dysuria, no fever, no vaginal bleeding, no vaginal discharge and no vomiting     Past Medical History  Diagnosis Date  . Chlamydia    History reviewed. No pertinent past surgical history. History reviewed. No pertinent family history. Social History  Substance Use Topics  . Smoking status: Never Smoker   . Smokeless tobacco: None  . Alcohol Use: No   OB History    No data available     Review of Systems  Constitutional: Negative for fever.  Gastrointestinal: Positive for nausea and abdominal pain. Negative for vomiting and diarrhea.  Genitourinary: Positive for menstrual problem. Negative for dysuria, urgency, frequency, flank pain, vaginal bleeding, vaginal discharge and pelvic pain.  All other systems reviewed and are negative.   Allergies  Review of patient's allergies indicates no known allergies.  Home Medications   Prior to Admission medications   Medication Sig Start Date End Date Taking? Authorizing Provider  ibuprofen (ADVIL,MOTRIN) 200 MG tablet Take 400 mg by mouth every 8 (eight) hours as needed. For pain.    Historical Provider, MD  OVER THE COUNTER MEDICATION Take 1 tablet by mouth daily. Sinus & Congestion medication     Historical Provider, MD  promethazine (PHENERGAN) 25 MG tablet Take 1 tablet (25 mg total) by mouth every 6 (six) hours as needed for nausea or vomiting. 03/07/15   Linna Hoff, MD   Meds Ordered and Administered this Visit  Medications - No data to display  BP 122/62 mmHg  Pulse 78  Temp(Src) 98.6 F (37 C) (Oral)  Resp 18  SpO2 100%  LMP 02/28/2015 No data found.   Physical Exam  Constitutional: She is oriented to person, place, and time. She appears well-developed and well-nourished. No distress.  Abdominal: Soft. Bowel sounds are normal. She exhibits no distension and no mass. There is no hepatosplenomegaly. There is tenderness in the epigastric area. There is no rigidity, no rebound, no guarding and no CVA tenderness.  Neurological: She is alert and oriented to person, place, and time.  Skin: Skin is warm and dry.  Nursing note and vitals reviewed.   ED Course  Procedures (including critical care time)  Labs Review Labs Reviewed  POCT URINALYSIS DIP (DEVICE) - Abnormal; Notable for the following:    Bilirubin Urine SMALL (*)    Ketones, ur 80 (*)    All other components within normal limits  POCT PREGNANCY, URINE   U/a and upreg neg.  Imaging Review No results found.   Visual Acuity Review  Right Eye Distance:   Left Eye Distance:   Bilateral Distance:    Right Eye Near:   Left Eye Near:    Bilateral Near:         MDM   1. Nausea in adult  Linna HoffJames D Kindl, MD 03/07/15 [phone]

## 2016-05-22 ENCOUNTER — Inpatient Hospital Stay
Admit: 2016-05-22 | Discharge: 2016-05-22 | Disposition: A | Discharge status: Home / Self Care | Payer: Self-pay | Attending: Emergency Medicine

## 2016-05-22 ENCOUNTER — Emergency Department: Admit: 2016-05-22 | Payer: Self-pay

## 2016-05-22 DIAGNOSIS — F12929 Cannabis use, unspecified with intoxication, unspecified: Secondary | ICD-10-CM

## 2016-05-22 LAB — EKG, 12 LEAD, INITIAL
Atrial Rate: 103 {beats}/min
Calculated P Axis: 63 degrees
Calculated R Axis: 57 degrees
Calculated T Axis: 41 degrees
P-R Interval: 144 ms
Q-T Interval: 330 ms
QRS Duration: 94 ms
QTC Calculation (Bezet): 432 ms
Ventricular Rate: 103 {beats}/min

## 2016-05-22 LAB — EKG 12-LEAD
Atrial Rate: 103 {beats}/min
P Axis: 63 degrees
P-R Interval: 144 ms
Q-T Interval: 330 ms
QRS Duration: 94 ms
QTc Calculation (Bazett): 432 ms
R Axis: 57 degrees
T Axis: 41 degrees
Ventricular Rate: 103 {beats}/min

## 2016-05-22 NOTE — ED Provider Notes (Signed)
HPI Comments: Pt is a 26 yo female with no sig PMH that presents to the ED via EMS for and episode of SOB that occurred briefly about an hour ago. Pt reports she was smoking a marijuana blunt, approx half of one, and after "taking a hit" she felt "funny, like I couldn't catch my breath". She reports since then the SOB and "funny feeling" has resolved. She denies any other illicit drug use or tobacco use. She reports she has smoked marijuana in the past but it has been a long time. She denies any modifying factors. She denies trying anything for her symptoms. She denies fever, chills, HA, dizziness, lightheadedness, congestion, cough, CP, palpitations, abd pain, N/V/D, numbness, tingling, weakness, or any other complaints at this time.     Patient is a 26 y.o. female presenting with shortness of breath. The history is provided by the patient.   Shortness of Breath   This is a new problem. The average episode lasts 10 minutes. The current episode started less than 1 hour ago. The problem has been resolved. Pertinent negatives include no fever, no headaches, no coryza, no rhinorrhea, no sore throat, no swollen glands, no ear pain, no neck pain, no cough, no sputum production, no hemoptysis, no wheezing, no PND, no orthopnea, no chest pain, no syncope, no vomiting, no abdominal pain, no rash, no leg pain, no leg swelling and no claudication. The problem's precipitants include smoke (Marijuana). She has tried nothing for the symptoms. She has had no prior hospitalizations. She has had no prior ED visits. She has had no prior ICU admissions. Associated medical issues do not include asthma, COPD or PE.        History reviewed. No pertinent past medical history.    History reviewed. No pertinent surgical history.      History reviewed. No pertinent family history.    Social History     Social History   ??? Marital status: SINGLE     Spouse name: N/A   ??? Number of children: N/A   ??? Years of education: N/A      Occupational History   ??? Not on file.     Social History Main Topics   ??? Smoking status: Not on file   ??? Smokeless tobacco: Not on file   ??? Alcohol use Not on file   ??? Drug use: Not on file   ??? Sexual activity: Not on file     Other Topics Concern   ??? Not on file     Social History Narrative   ??? No narrative on file         ALLERGIES: Review of patient's allergies indicates no known allergies.    Review of Systems   Constitutional: Negative for chills and fever.   HENT: Negative for congestion, ear pain, rhinorrhea and sore throat.    Eyes: Negative for pain and redness.   Respiratory: Positive for shortness of breath. Negative for apnea, cough, hemoptysis, sputum production, choking, chest tightness, wheezing and stridor.    Cardiovascular: Negative for chest pain, palpitations, orthopnea, claudication, leg swelling, syncope and PND.   Gastrointestinal: Negative for abdominal pain, constipation, diarrhea, nausea and vomiting.   Genitourinary: Negative for dysuria.   Musculoskeletal: Negative for arthralgias, back pain, gait problem, joint swelling, myalgias, neck pain and neck stiffness.   Skin: Negative.  Negative for rash.   Allergic/Immunologic: Negative for environmental allergies and immunocompromised state.   Neurological: Negative for dizziness, tremors, seizures, syncope, facial asymmetry, speech difficulty,  weakness, light-headedness, numbness and headaches.   Hematological: Negative for adenopathy.   Psychiatric/Behavioral: Negative.    All other systems reviewed and are negative.      Vitals:    05/22/16 1242   BP: 125/81   Pulse: (!) 111   Resp: 16   Temp: 97.6 ??F (36.4 ??C)   SpO2: 98%   Weight: 54.4 kg (120 lb)   Height: 5' (1.524 m)            Physical Exam   Constitutional: She is oriented to person, place, and time. She appears well-developed and well-nourished. She is active and cooperative.  Non-toxic appearance. She does not have a sickly appearance. She does not appear ill. No distress.    HENT:   Head: Normocephalic and atraumatic.   Right Ear: Tympanic membrane, external ear and ear canal normal.   Left Ear: Tympanic membrane, external ear and ear canal normal.   Nose: Nose normal.   Mouth/Throat: Oropharynx is clear and moist and mucous membranes are normal. No oropharyngeal exudate.   Eyes: Conjunctivae and EOM are normal. Pupils are equal, round, and reactive to light.   Neck: Normal range of motion, full passive range of motion without pain and phonation normal. Neck supple. No JVD present.   Cardiovascular: Regular rhythm, normal heart sounds and intact distal pulses.  Tachycardia present.  Exam reveals no gallop and no friction rub.    No murmur heard.  Pulmonary/Chest: Effort normal and breath sounds normal. No accessory muscle usage or stridor. No tachypnea and no bradypnea. No respiratory distress. She has no decreased breath sounds. She has no wheezes. She has no rhonchi. She has no rales.   Abdominal: Soft. Bowel sounds are normal. There is no tenderness.   Musculoskeletal: Normal range of motion. She exhibits no edema or tenderness.   Lymphadenopathy:     She has no cervical adenopathy.   Neurological: She is alert and oriented to person, place, and time. Coordination normal.   Skin: Skin is warm and dry. No rash noted. She is not diaphoretic. No erythema. No pallor.   Psychiatric: She has a normal mood and affect. Her behavior is normal. Judgment and thought content normal.   Nursing note and vitals reviewed.       MDM  Number of Diagnoses or Management Options  Marijuana intoxication, with unspecified complication [facility]): new and requires workup  SOB (shortness of breath): new and requires workup  Diagnosis management comments: DDx: drug intoxication vs reaction, viral vs bacterial URI, asthma exacerbation, COPD, bronchitis, PNE, PE, allergies, pneumothorax    EKG Sinus rhythm, tachy at 103, otherwise without any ST changes or  arrhythmias noted. CXR neg for acute cardiopulmonary process. Pt symptoms began after smoking marijuana, have fully resolved. SOB likely triggered d/t marijuana smoking/intoxication. Pt well appearing, without tachypnea or hypoxia, do not feel further workup indicated at this time. Pt to f/u with PCP, counseled on cessation of marijuana use. Pt given strict ED return precautions.     Pt results have been reviewed with the patient and any family present. They have been counseled regarding diagnosis, treatment, and plan. They verbally convey understanding and agreement of the signs, symptoms, diagnosis, treatment and prognosis and additionally agrees to follow up as discussed. They also agree with the care-plan and convey that all of their questions have been answered. I have also provided discharge instructions for them that include: educational information regarding their diagnosis and treatment, and list of reasons why they would want to  return to the ED prior to their follow-up appointment, should their condition change.   Cindra Eves PA-C  2:28 PM        Amount and/or Complexity of Data Reviewed  Tests in the radiology section of CPT??: ordered and reviewed  Tests in the medicine section of CPT??: ordered and reviewed  Review and summarize past medical records: yes  Discuss the patient with other providers: yes  Independent visualization of images, tracings, or specimens: yes    Risk of Complications, Morbidity, and/or Mortality  Presenting problems: low  Diagnostic procedures: low  Management options: low    Patient Progress  Patient progress: stable        ED Course       EKG  Date/Time: 05/22/2016 1:07 PM  Performed by: Mechele Collin.  Authorized by: Mechele Collin     ECG reviewed by ED Physician in the absence of a cardiologist: yes    Previous ECG:     Previous ECG:  Unavailable  Interpretation:     Interpretation: normal    Rate:     ECG rate:  103    ECG rate assessment: tachycardic    Rhythm:      Rhythm: sinus rhythm    Ectopy:     Ectopy: none    QRS:     QRS axis:  Normal    QRS intervals:  Normal  Conduction:     Conduction: normal    ST segments:     ST segments:  Normal  T waves:     T waves: normal          Diagnosis:   1. Marijuana intoxication, with unspecified complication (HCC)    2. SOB (shortness of breath)          Disposition: Discharge to home.     Follow-up Information     Follow up With Details Comments Contact Info    [facility] EMERGENCY DEPT  As needed, If symptoms worsen [address] [place] [address]  [phone]    [facility] in 2 days  [address]  [place] [address]  [phone]    [facility] in 2 days  [address] [place] [address]  [phone]          Patient's Medications    No medications on file

## 2016-05-22 NOTE — ED Notes (Signed)
I have reviewed discharge instructions with the patient.  The patient verbalized understanding.

## 2016-05-22 NOTE — ED Triage Notes (Signed)
Pt reports smoking weed around 11am, she states that off and on she feels like she can't breathe.

## 2021-05-15 DIAGNOSIS — Z0000 Encounter for general adult medical examination without abnormal findings: Secondary | ICD-10-CM | POA: Diagnosis not present

## 2021-05-15 DIAGNOSIS — Z23 Encounter for immunization: Secondary | ICD-10-CM | POA: Diagnosis not present

## 2021-07-23 ENCOUNTER — Encounter (HOSPITAL_COMMUNITY): Payer: Self-pay | Admitting: Emergency Medicine

## 2021-07-23 ENCOUNTER — Ambulatory Visit (HOSPITAL_COMMUNITY)
Admission: EM | Admit: 2021-07-23 | Discharge: 2021-07-23 | Disposition: A | Discharge status: Home / Self Care | Payer: Self-pay | Attending: Family Medicine | Admitting: Family Medicine

## 2021-07-23 DIAGNOSIS — J069 Acute upper respiratory infection, unspecified: Secondary | ICD-10-CM | POA: Insufficient documentation

## 2021-07-23 DIAGNOSIS — Z20822 Contact with and (suspected) exposure to covid-19: Secondary | ICD-10-CM | POA: Insufficient documentation

## 2021-07-23 LAB — SARS CORONAVIRUS 2 (TAT 6-24 HRS): SARS Coronavirus 2: NEGATIVE

## 2021-07-23 LAB — POCT RAPID STREP A, ED / UC: Streptococcus, Group A Screen (Direct): NEGATIVE

## 2021-07-23 NOTE — ED Triage Notes (Signed)
Sunday after work noticed having congestion and raw feeling. Having sore throat. Needs testing before returns to work.  ?

## 2021-07-23 NOTE — ED Provider Notes (Signed)
?[facility] ? ? ? ?CSN: [id number] ?Arrival date & time: 07/23/21  1201 ? ? ?  ? ?History   ?Chief Complaint ?Chief Complaint  ?Patient presents with  ? Nasal Congestion  ? ? ?HPI ?Darlene Buckley is a 31 y.o. female.  ? ?HPI ?Here for sore throat and nasal congestion that began May 7.  Not much cough.  She has some subjective fever yesterday.  No vomiting or diarrhea ? ? ? ?Past Medical History:  ?Diagnosis Date  ? Chlamydia   ? ? ?There are no problems to display for this patient. ? ? ?History reviewed. No pertinent surgical history. ? ?OB History   ?No obstetric history on file. ?  ? ? ? ?Home Medications   ? ?Prior to Admission medications   ?Medication Sig Start Date End Date Taking? Authorizing Provider  ?ibuprofen (ADVIL,MOTRIN) 200 MG tablet Take 400 mg by mouth every 8 (eight) hours as needed. For pain.    [provider]  ?OVER THE COUNTER MEDICATION Take 1 tablet by mouth daily. Sinus & Congestion medication    [provider]  ? ? ?Family History ?No family history on file. ? ?Social History ?Social History  ? ?Tobacco Use  ? Smoking status: Never  ?Substance Use Topics  ? Alcohol use: No  ? Drug use: Yes  ?  Types: Marijuana  ? ? ? ?Allergies   ?Patient has no known allergies. ? ? ?Review of Systems ?Review of Systems ? ? ?Physical Exam ?Triage Vital Signs ?ED Triage Vitals  ?Enc Vitals Group  ?   BP 07/23/21 1255 99/70  ?   Pulse Rate 07/23/21 1255 99  ?   Resp 07/23/21 1255 17  ?   Temp 07/23/21 1255 98.6 ?F (37 ?C)  ?   Temp Source 07/23/21 1255 Oral  ?   SpO2 07/23/21 1255 98 %  ?   Weight --   ?   Height --   ?   Head Circumference --   ?   Peak Flow --   ?   Pain Score 07/23/21 1254 0  ?   Pain Loc --   ?   Pain Edu? --   ?   Excl. in GC? --   ? ?No data found. ? ?Updated Vital Signs ?BP 99/70 (BP Location: Left Arm)   Pulse 99   Temp 98.6 ?F (37 ?C) (Oral)   Resp 17   LMP 07/03/2021   SpO2 98%  ? ?Visual Acuity ?Right Eye Distance:   ?Left Eye Distance:   ?Bilateral  Distance:   ? ?Right Eye Near:   ?Left Eye Near:    ?Bilateral Near:    ? ?Physical Exam ?Vitals reviewed.  ?Constitutional:   ?   General: She is not in acute distress. ?   Appearance: She is not toxic-appearing.  ?HENT:  ?   Right Ear: Tympanic membrane and ear canal normal.  ?   Left Ear: Tympanic membrane and ear canal normal.  ?   Nose: Nose normal.  ?   Mouth/Throat:  ?   Mouth: Mucous membranes are moist.  ?   Comments: Clear mucus in the oropharynx and erythema of the posterior oropharynx and of the tonsillar pillars ?Eyes:  ?   Extraocular Movements: Extraocular movements intact.  ?   Conjunctiva/sclera: Conjunctivae normal.  ?   Pupils: Pupils are equal, round, and reactive to light.  ?Cardiovascular:  ?   Rate and Rhythm: Normal rate and regular rhythm.  ?  Heart sounds: No murmur heard. ?Pulmonary:  ?   Effort: Pulmonary effort is normal. No respiratory distress.  ?   Breath sounds: No wheezing, rhonchi or rales.  ?Musculoskeletal:  ?   Cervical back: Neck supple.  ?Lymphadenopathy:  ?   Cervical: No cervical adenopathy.  ?Skin: ?   Capillary Refill: Capillary refill takes less than 2 seconds.  ?   Coloration: Skin is not jaundiced or pale.  ?Neurological:  ?   General: No focal deficit present.  ?   Mental Status: She is alert and oriented to person, place, and time.  ?Psychiatric:     ?   Behavior: Behavior normal.  ? ? ? ?UC Treatments / Results  ?Labs ?(all labs ordered are listed, but only abnormal results are displayed) ?Labs Reviewed  ?CULTURE, GROUP A STREP [facility])  ?SARS CORONAVIRUS 2 (TAT 6-24 HRS)  ?POCT RAPID STREP A, ED / UC  ? ? ?EKG ? ? ?Radiology ?No results found. ? ?Procedures ?Procedures (including critical care time) ? ?Medications Ordered in UC ?Medications - No data to display ? ?Initial Impression / Assessment and Plan / UC Course  ?I have reviewed the triage vital signs and the nursing notes. ? ?Pertinent labs & imaging results that were available during my care of the patient  were reviewed by me and considered in my medical decision making (see chart for details). ? ?  ? ?Rapid strep test is negative; throat culture sent to confirm ? ?COVID swab done, and staff will notify her if positive ?Final Clinical Impressions(s) / UC Diagnoses  ? ?Final diagnoses:  ?Viral upper respiratory tract infection  ? ? ? ?Discharge Instructions   ? ?  ?Rapid strep is negative; throat culture is sent to confirm that is correct. ? ? ?You have been swabbed for COVID, and the test will result in the next 24 hours. Our staff will call you if positive. If the test is positive, you should quarantine for 5 days.  ? ?This may be some other viral illness.  Treat the symptoms with over-the-counter medication such as DayQuil or NyQuil ? ? ? ? ?ED Prescriptions   ?None ?  ? ?PDMP not reviewed this encounter. ?  ?Zenia Resides, MD ?07/23/21 1344 ? ?

## 2021-07-23 NOTE — Discharge Instructions (Addendum)
Rapid strep is negative; throat culture is sent to confirm that is correct. ? ? ?You have been swabbed for COVID, and the test will result in the next 24 hours. Our staff will call you if positive. If the test is positive, you should quarantine for 5 days.  ? ?This may be some other viral illness.  Treat the symptoms with over-the-counter medication such as DayQuil or NyQuil ?

## 2021-07-25 LAB — CULTURE, GROUP A STREP (THRC)

## 2021-10-23 DIAGNOSIS — Z124 Encounter for screening for malignant neoplasm of cervix: Secondary | ICD-10-CM | POA: Diagnosis not present

## 2021-10-23 DIAGNOSIS — N898 Other specified noninflammatory disorders of vagina: Secondary | ICD-10-CM | POA: Diagnosis not present

## 2021-10-23 DIAGNOSIS — Z13220 Encounter for screening for lipoid disorders: Secondary | ICD-10-CM | POA: Diagnosis not present

## 2021-10-23 DIAGNOSIS — Z8619 Personal history of other infectious and parasitic diseases: Secondary | ICD-10-CM | POA: Diagnosis not present

## 2021-10-23 DIAGNOSIS — Z8742 Personal history of other diseases of the female genital tract: Secondary | ICD-10-CM | POA: Diagnosis not present

## 2021-10-23 DIAGNOSIS — Z113 Encounter for screening for infections with a predominantly sexual mode of transmission: Secondary | ICD-10-CM | POA: Diagnosis not present

## 2021-10-23 DIAGNOSIS — Z131 Encounter for screening for diabetes mellitus: Secondary | ICD-10-CM | POA: Diagnosis not present

## 2021-10-23 DIAGNOSIS — R69 Illness, unspecified: Secondary | ICD-10-CM | POA: Diagnosis not present

## 2021-10-23 DIAGNOSIS — Z01419 Encounter for gynecological examination (general) (routine) without abnormal findings: Secondary | ICD-10-CM | POA: Diagnosis not present

## 2022-01-30 DIAGNOSIS — R519 Headache, unspecified: Secondary | ICD-10-CM | POA: Diagnosis not present

## 2022-01-30 DIAGNOSIS — H539 Unspecified visual disturbance: Secondary | ICD-10-CM | POA: Diagnosis not present

## 2022-02-13 DIAGNOSIS — H539 Unspecified visual disturbance: Secondary | ICD-10-CM | POA: Diagnosis not present

## 2022-02-13 DIAGNOSIS — R42 Dizziness and giddiness: Secondary | ICD-10-CM | POA: Diagnosis not present

## 2022-02-13 DIAGNOSIS — R519 Headache, unspecified: Secondary | ICD-10-CM | POA: Diagnosis not present

## 2022-02-17 DIAGNOSIS — Z111 Encounter for screening for respiratory tuberculosis: Secondary | ICD-10-CM | POA: Diagnosis not present

## 2022-02-19 DIAGNOSIS — Z0279 Encounter for issue of other medical certificate: Secondary | ICD-10-CM | POA: Diagnosis not present

## 2022-02-26 DIAGNOSIS — Z1331 Encounter for screening for depression: Secondary | ICD-10-CM | POA: Diagnosis not present

## 2022-02-26 DIAGNOSIS — G43809 Other migraine, not intractable, without status migrainosus: Secondary | ICD-10-CM | POA: Diagnosis not present

## 2022-02-26 DIAGNOSIS — E668 Other obesity: Secondary | ICD-10-CM | POA: Diagnosis not present

## 2022-05-06 DIAGNOSIS — Z113 Encounter for screening for infections with a predominantly sexual mode of transmission: Secondary | ICD-10-CM | POA: Diagnosis not present

## 2022-05-06 DIAGNOSIS — R110 Nausea: Secondary | ICD-10-CM | POA: Diagnosis not present

## 2022-05-06 DIAGNOSIS — N951 Menopausal and female climacteric states: Secondary | ICD-10-CM | POA: Diagnosis not present

## 2022-05-06 DIAGNOSIS — R140 Abdominal distension (gaseous): Secondary | ICD-10-CM | POA: Diagnosis not present

## 2022-05-06 DIAGNOSIS — Z3202 Encounter for pregnancy test, result negative: Secondary | ICD-10-CM | POA: Diagnosis not present

## 2022-05-06 DIAGNOSIS — N644 Mastodynia: Secondary | ICD-10-CM | POA: Diagnosis not present

## 2022-05-06 DIAGNOSIS — N898 Other specified noninflammatory disorders of vagina: Secondary | ICD-10-CM | POA: Diagnosis not present

## 2022-05-06 DIAGNOSIS — R350 Frequency of micturition: Secondary | ICD-10-CM | POA: Diagnosis not present

## 2022-05-08 DIAGNOSIS — M542 Cervicalgia: Secondary | ICD-10-CM | POA: Diagnosis not present

## 2022-05-08 DIAGNOSIS — M9908 Segmental and somatic dysfunction of rib cage: Secondary | ICD-10-CM | POA: Diagnosis not present

## 2022-05-08 DIAGNOSIS — E668 Other obesity: Secondary | ICD-10-CM | POA: Diagnosis not present

## 2022-05-08 DIAGNOSIS — M9902 Segmental and somatic dysfunction of thoracic region: Secondary | ICD-10-CM | POA: Diagnosis not present

## 2022-05-08 DIAGNOSIS — G43809 Other migraine, not intractable, without status migrainosus: Secondary | ICD-10-CM | POA: Diagnosis not present

## 2022-05-08 DIAGNOSIS — N644 Mastodynia: Secondary | ICD-10-CM | POA: Diagnosis not present

## 2022-05-08 DIAGNOSIS — M9907 Segmental and somatic dysfunction of upper extremity: Secondary | ICD-10-CM | POA: Diagnosis not present

## 2022-05-08 DIAGNOSIS — M9901 Segmental and somatic dysfunction of cervical region: Secondary | ICD-10-CM | POA: Diagnosis not present

## 2022-05-10 ENCOUNTER — Other Ambulatory Visit: Payer: Self-pay

## 2022-05-10 ENCOUNTER — Emergency Department (HOSPITAL_COMMUNITY)
Admission: EM | Admit: 2022-05-10 | Discharge: 2022-05-11 | Disposition: A | Discharge status: Home / Self Care | Payer: 59 | Attending: Emergency Medicine | Admitting: Emergency Medicine

## 2022-05-10 ENCOUNTER — Emergency Department (HOSPITAL_COMMUNITY): Payer: 59

## 2022-05-10 DIAGNOSIS — R002 Palpitations: Secondary | ICD-10-CM

## 2022-05-10 DIAGNOSIS — R000 Tachycardia, unspecified: Secondary | ICD-10-CM | POA: Insufficient documentation

## 2022-05-10 LAB — CBC WITH DIFFERENTIAL/PLATELET
Abs Immature Granulocytes: 0.02 10*3/uL (ref 0.00–0.07)
Basophils Absolute: 0.1 10*3/uL (ref 0.0–0.1)
Basophils Relative: 1 %
Eosinophils Absolute: 0.1 10*3/uL (ref 0.0–0.5)
Eosinophils Relative: 1 %
HCT: 37.4 % (ref 36.0–46.0)
Hemoglobin: 11.8 g/dL — ABNORMAL LOW (ref 12.0–15.0)
Immature Granulocytes: 0 %
Lymphocytes Relative: 20 %
Lymphs Abs: 1.9 10*3/uL (ref 0.7–4.0)
MCH: 25.8 pg — ABNORMAL LOW (ref 26.0–34.0)
MCHC: 31.6 g/dL (ref 30.0–36.0)
MCV: 81.8 fL (ref 80.0–100.0)
Monocytes Absolute: 0.8 10*3/uL (ref 0.1–1.0)
Monocytes Relative: 9 %
Neutro Abs: 6.8 10*3/uL (ref 1.7–7.7)
Neutrophils Relative %: 69 %
Platelets: 357 10*3/uL (ref 150–400)
RBC: 4.57 MIL/uL (ref 3.87–5.11)
RDW: 14.3 % (ref 11.5–15.5)
WBC: 9.6 10*3/uL (ref 4.0–10.5)
nRBC: 0 % (ref 0.0–0.2)

## 2022-05-10 LAB — COMPREHENSIVE METABOLIC PANEL
ALT: 12 U/L (ref 0–44)
AST: 23 U/L (ref 15–41)
Albumin: 3.8 g/dL (ref 3.5–5.0)
Alkaline Phosphatase: 81 U/L (ref 38–126)
Anion gap: 7 (ref 5–15)
BUN: 6 mg/dL (ref 6–20)
CO2: 25 mmol/L (ref 22–32)
Calcium: 8.7 mg/dL — ABNORMAL LOW (ref 8.9–10.3)
Chloride: 105 mmol/L (ref 98–111)
Creatinine, Ser: 0.62 mg/dL (ref 0.44–1.00)
GFR, Estimated: >60 mL/min (ref >60)
Glucose, Bld: 99 mg/dL (ref 70–99)
Potassium: 3.6 mmol/L (ref 3.5–5.1)
Sodium: 137 mmol/L (ref 135–145)
Total Bilirubin: 0.6 mg/dL (ref 0.3–1.2)
Total Protein: 7.4 g/dL (ref 6.5–8.1)

## 2022-05-10 MED ORDER — LACTATED RINGERS IV BOLUS
1000.0000 mL | Freq: Once | INTRAVENOUS | Status: AC
  Administered 2022-05-10: 1000 mL via INTRAVENOUS

## 2022-05-10 NOTE — ED Provider Notes (Signed)
[facility] Provider Note   CSN: [id number] Arrival date & time: 05/10/22  2016     History  Chief Complaint  Patient presents with   Palpitations    Darlene Buckley is a 32 y.o. female.  Patient is a 32 year old female with no significant past medical history presenting to the emergency department with palpitations.  Patient reports over the last few months she has had occasional palpitations and that they have been occurring more frequently over the last few days.  She states that they mostly occur at rest when she is watching TV or driving.  She states that she has no associated chest pain or shortness of breath.  She states that she will occasionally feel lightheaded and nauseous but denies any vomiting or diarrhea.  She denies any hot or cold intolerance.  She denies any caffeine use, tobacco use, drug or alcohol use.  The history is provided by the patient.  Palpitations      Home Medications Prior to Admission medications   Medication Sig Start Date End Date Taking? Authorizing Provider  cyclobenzaprine (FLEXERIL) 5 MG tablet Take 5 mg by mouth 3 (three) times daily as needed for muscle spasms.   Yes [provider]  ibuprofen (ADVIL,MOTRIN) 200 MG tablet Take 400 mg by mouth every 8 (eight) hours as needed. For pain.   Yes [provider]      Allergies    Patient has no known allergies.    Review of Systems   Review of Systems  Cardiovascular:  Positive for palpitations.    Physical Exam Updated Vital Signs BP 114/69   Pulse 94   Temp 98.1 F (36.7 C) (Oral)   Resp 12   SpO2 96%  Physical Exam Vitals and nursing note reviewed.  Constitutional:      General: She is not in acute distress.    Appearance: Normal appearance.  HENT:     Head: Normocephalic and atraumatic.     Nose: Nose normal.     Mouth/Throat:     Mouth: Mucous membranes are moist.     Pharynx: Oropharynx is clear.  Eyes:      Extraocular Movements: Extraocular movements intact.     Conjunctiva/sclera: Conjunctivae normal.  Neck:     Comments: No palpable thyromegaly Cardiovascular:     Rate and Rhythm: Regular rhythm. Tachycardia present.     Pulses: Normal pulses.     Heart sounds: Normal heart sounds.  Pulmonary:     Effort: Pulmonary effort is normal.     Breath sounds: Normal breath sounds.  Abdominal:     General: Abdomen is flat.     Palpations: Abdomen is soft.     Tenderness: There is no abdominal tenderness.  Musculoskeletal:     Cervical back: Normal range of motion and neck supple. No tenderness.     Right lower leg: No edema.     Left lower leg: No edema.  Skin:    General: Skin is warm and dry.  Neurological:     General: No focal deficit present.     Mental Status: She is alert and oriented to person, place, and time.  Psychiatric:        Mood and Affect: Mood normal.        Behavior: Behavior normal.     ED Results / Procedures / Treatments   Labs (all labs ordered are listed, but only abnormal results are displayed) Labs Reviewed  CBC  WITH DIFFERENTIAL/PLATELET - Abnormal; Notable for the following components:      Result Value   Hemoglobin 11.8 (*)    MCH 25.8 (*)    All other components within normal limits  COMPREHENSIVE METABOLIC PANEL - Abnormal; Notable for the following components:   Calcium 8.7 (*)    All other components within normal limits  PREGNANCY, URINE  TSH    EKG EKG Interpretation  Date/Time:  Saturday May 10 2022 21:38:20 EST Ventricular Rate:  98 PR Interval:  146 QRS Duration: 104 QT Interval:  345 QTC Calculation: 441 R Axis:   70 Text Interpretation: Sinus rhythm No previous ECGs available Confirmed by Leanord Asal ([id number]) on 05/10/2022 9:48:38 PM  Radiology DG Chest Port 1 View  Result Date: 05/10/2022 CLINICAL DATA:  Pt reports period of her heart "fluttering" at random times since December. Feels they are happening more  frequently and wishes to see what may be causing these. Palpitations EXAM: PORTABLE CHEST - 1 VIEW COMPARISON:  08/22/2011 FINDINGS: Lungs are clear. Heart size and mediastinal contours are within normal limits. No effusion. Visualized bones unremarkable. IMPRESSION: No acute cardiopulmonary disease. Electronically Signed   By: Lucrezia Europe M.D.   On: 05/10/2022 21:12    Procedures Procedures    Medications Ordered in ED Medications  lactated ringers bolus 1,000 mL (has no administration in time range)    ED Course/ Medical Decision Making/ A&P Clinical Course as of 05/10/22 2314  Sat May 10, 2022  2313 Patient's labs are within normal range. TSH and urine preg pending. Patient signed out to Dr. Ralene Bathe pending urine with plan for likely discharge home. [VK]    Clinical Course User Index [VK] Kemper Durie, DO                             Medical Decision Making This patient presents to the ED with chief complaint(s) of palpitations with no pertinent past medical history which further complicates the presenting complaint. The complaint involves an extensive differential diagnosis and also carries with it a high risk of complications and morbidity.    The differential diagnosis includes arrhythmia, anemia, electrolyte abnormality, dehydration, thyroid dysfunction  Additional history obtained: Additional history obtained from N/A Records reviewed Primary Care Documents  ED Course and Reassessment: On patient's arrival to the emergency department she was tachycardic but otherwise hemodynamically stable.  EKG on arrival showed sinus tachycardia.  The patient will continue to be monitored on telemetry and will have labs performed closely reassessed. Independent labs interpretation:  The following labs were independently interpreted: within normal range, TSH and urine preg pending  Independent visualization of imaging: N/A  Consultation: - Consulted or discussed management/test  interpretation w/ external professional: N/a     Amount and/or Complexity of Data Reviewed Labs: ordered.          Final Clinical Impression(s) / ED Diagnoses Final diagnoses:  Palpitations  Sinus tachycardia    Rx / DC Orders ED Discharge Orders          Ordered    Ambulatory referral to Cardiology       Comments: If you have not heard from the Cardiology office within the next 72 hours please call [phone].   05/10/22 [place], [place], DO 05/10/22 2314

## 2022-05-10 NOTE — Discharge Instructions (Addendum)
You were seen in the emergency department for your palpitations. Your labs were normal and your heart rate was fast but in a normal rhythm. You can follow up with your primary doctor and cardiology to have your symptoms rechecked and for further workup to determine the cause of your heart palpitations.  We did send a thyroid level and if this is abnormal you will receive a call.  You should return to the emergency department if you feel like your heart is racing and you feel lightheaded, you have chest pain, you pass out or if you have any other new or concerning symptoms.

## 2022-05-10 NOTE — ED Provider Triage Note (Signed)
Emergency Medicine Provider Triage Evaluation Note  Darlene Buckley , a 32 y.o. female  was evaluated in triage.  Pt complains of palpitations.  She reports these palpitations have been happening over the last several days and has been concerned as she feels like it is a slight heaviness in her chest.  Patient denies any actual chest pain.  Has had some associated nausea but no vomiting or diarrhea.  No fevers, no known sick contacts, does not believe she is pregnant as she has recently had a negative pregnancy test.  No prior history of any heart disease or high blood pressure.  Review of Systems  Positive: As above Negative: As above  Physical Exam  BP (!) 152/79 (BP Location: Right Arm)   Pulse (!) 114   Temp 98.1 F (36.7 C) (Oral)   Resp 18   SpO2 100%  Gen:   Awake, no distress  Resp:  Normal effort  MSK:   Moves extremities without difficulty  Other:    Medical Decision Making  Medically screening exam initiated at 8:53 PM.  Appropriate orders placed.  [name] was informed that the remainder of the evaluation will be completed by another provider, this initial triage assessment does not replace that evaluation, and the importance of remaining in the ED until their evaluation is complete.     [name], PA-C 05/10/22 2059

## 2022-05-10 NOTE — ED Triage Notes (Signed)
Pt reports period of her heart "fluttering" at random times since December.  Feels they are happening more frequently and wishes to see what may be causing these.  No CP, n/v/d or shob.

## 2022-05-11 DIAGNOSIS — R000 Tachycardia, unspecified: Secondary | ICD-10-CM | POA: Diagnosis not present

## 2022-05-11 LAB — TSH: TSH: 2.039 u[IU]/mL (ref 0.350–4.500)

## 2022-05-11 LAB — PREGNANCY, URINE: Preg Test, Ur: NEGATIVE

## 2022-05-13 DIAGNOSIS — R002 Palpitations: Secondary | ICD-10-CM | POA: Diagnosis not present

## 2022-05-13 DIAGNOSIS — F419 Anxiety disorder, unspecified: Secondary | ICD-10-CM | POA: Diagnosis not present

## 2022-07-08 DIAGNOSIS — N951 Menopausal and female climacteric states: Secondary | ICD-10-CM | POA: Diagnosis not present

## 2022-12-25 DIAGNOSIS — R946 Abnormal results of thyroid function studies: Secondary | ICD-10-CM | POA: Diagnosis not present

## 2022-12-25 DIAGNOSIS — Z1389 Encounter for screening for other disorder: Secondary | ICD-10-CM | POA: Diagnosis not present

## 2023-01-29 DIAGNOSIS — G43809 Other migraine, not intractable, without status migrainosus: Secondary | ICD-10-CM | POA: Diagnosis not present

## 2023-01-29 DIAGNOSIS — Z0000 Encounter for general adult medical examination without abnormal findings: Secondary | ICD-10-CM | POA: Diagnosis not present

## 2023-01-29 DIAGNOSIS — E66812 Obesity, class 2: Secondary | ICD-10-CM | POA: Diagnosis not present

## 2023-01-29 DIAGNOSIS — Z1331 Encounter for screening for depression: Secondary | ICD-10-CM | POA: Diagnosis not present

## 2023-01-29 DIAGNOSIS — M79671 Pain in right foot: Secondary | ICD-10-CM | POA: Diagnosis not present

## 2023-01-29 DIAGNOSIS — M79672 Pain in left foot: Secondary | ICD-10-CM | POA: Diagnosis not present

## 2023-03-25 ENCOUNTER — Ambulatory Visit: Payer: 59 | Admitting: Skilled Nursing Facility1

## 2023-04-30 DIAGNOSIS — N898 Other specified noninflammatory disorders of vagina: Secondary | ICD-10-CM | POA: Diagnosis not present

## 2023-04-30 DIAGNOSIS — Z113 Encounter for screening for infections with a predominantly sexual mode of transmission: Secondary | ICD-10-CM | POA: Diagnosis not present

## 2023-04-30 DIAGNOSIS — Z01419 Encounter for gynecological examination (general) (routine) without abnormal findings: Secondary | ICD-10-CM | POA: Diagnosis not present

## 2023-04-30 DIAGNOSIS — Z1151 Encounter for screening for human papillomavirus (HPV): Secondary | ICD-10-CM | POA: Diagnosis not present

## 2023-04-30 DIAGNOSIS — Z8742 Personal history of other diseases of the female genital tract: Secondary | ICD-10-CM | POA: Diagnosis not present

## 2023-04-30 DIAGNOSIS — Z124 Encounter for screening for malignant neoplasm of cervix: Secondary | ICD-10-CM | POA: Diagnosis not present

## 2023-06-11 DIAGNOSIS — K219 Gastro-esophageal reflux disease without esophagitis: Secondary | ICD-10-CM | POA: Diagnosis not present

## 2023-06-11 DIAGNOSIS — N644 Mastodynia: Secondary | ICD-10-CM | POA: Diagnosis not present

## 2023-06-11 DIAGNOSIS — B977 Papillomavirus as the cause of diseases classified elsewhere: Secondary | ICD-10-CM | POA: Diagnosis not present

## 2023-06-11 DIAGNOSIS — N946 Dysmenorrhea, unspecified: Secondary | ICD-10-CM | POA: Diagnosis not present

## 2023-06-22 DIAGNOSIS — J302 Other seasonal allergic rhinitis: Secondary | ICD-10-CM | POA: Diagnosis not present

## 2023-06-22 DIAGNOSIS — K219 Gastro-esophageal reflux disease without esophagitis: Secondary | ICD-10-CM | POA: Diagnosis not present

## 2023-06-22 DIAGNOSIS — J0100 Acute maxillary sinusitis, unspecified: Secondary | ICD-10-CM | POA: Diagnosis not present

## 2023-06-29 DIAGNOSIS — Z113 Encounter for screening for infections with a predominantly sexual mode of transmission: Secondary | ICD-10-CM | POA: Diagnosis not present

## 2023-07-16 DIAGNOSIS — F4010 Social phobia, unspecified: Secondary | ICD-10-CM | POA: Diagnosis not present

## 2023-07-28 DIAGNOSIS — J302 Other seasonal allergic rhinitis: Secondary | ICD-10-CM | POA: Diagnosis not present

## 2023-07-28 DIAGNOSIS — J0100 Acute maxillary sinusitis, unspecified: Secondary | ICD-10-CM | POA: Diagnosis not present

## 2023-07-30 DIAGNOSIS — F4010 Social phobia, unspecified: Secondary | ICD-10-CM | POA: Diagnosis not present

## 2023-08-06 DIAGNOSIS — J0141 Acute recurrent pansinusitis: Secondary | ICD-10-CM | POA: Diagnosis not present

## 2023-08-06 DIAGNOSIS — F4010 Social phobia, unspecified: Secondary | ICD-10-CM | POA: Diagnosis not present

## 2023-08-06 DIAGNOSIS — J302 Other seasonal allergic rhinitis: Secondary | ICD-10-CM | POA: Diagnosis not present

## 2023-08-06 DIAGNOSIS — K029 Dental caries, unspecified: Secondary | ICD-10-CM | POA: Diagnosis not present

## 2023-08-06 DIAGNOSIS — M7989 Other specified soft tissue disorders: Secondary | ICD-10-CM | POA: Diagnosis not present

## 2023-08-06 DIAGNOSIS — J0100 Acute maxillary sinusitis, unspecified: Secondary | ICD-10-CM | POA: Diagnosis not present

## 2023-08-13 DIAGNOSIS — F4010 Social phobia, unspecified: Secondary | ICD-10-CM | POA: Diagnosis not present

## 2023-08-20 DIAGNOSIS — F4010 Social phobia, unspecified: Secondary | ICD-10-CM | POA: Diagnosis not present

## 2023-08-27 ENCOUNTER — Emergency Department (HOSPITAL_COMMUNITY)
Admission: EM | Admit: 2023-08-27 | Discharge: 2023-08-27 | Disposition: A | Discharge status: Home / Self Care | Payer: Self-pay | Attending: Emergency Medicine | Admitting: Emergency Medicine

## 2023-08-27 ENCOUNTER — Other Ambulatory Visit: Payer: Self-pay

## 2023-08-27 ENCOUNTER — Encounter (HOSPITAL_COMMUNITY): Payer: Self-pay

## 2023-08-27 DIAGNOSIS — T7840XA Allergy, unspecified, initial encounter: Secondary | ICD-10-CM | POA: Insufficient documentation

## 2023-08-27 MED ORDER — PREDNISONE 20 MG PO TABS
60.0000 mg | ORAL_TABLET | Freq: Once | ORAL | Status: AC
  Administered 2023-08-27: 60 mg via ORAL
  Filled 2023-08-27: qty 3

## 2023-08-27 MED ORDER — EPINEPHRINE 0.3 MG/0.3ML IJ SOAJ: 0.3000 mg | INTRAMUSCULAR | 0 refills | Status: AC | PRN

## 2023-08-27 MED ORDER — FAMOTIDINE 20 MG PO TABS
20.0000 mg | ORAL_TABLET | Freq: Once | ORAL | Status: AC
  Administered 2023-08-27: 20 mg via ORAL
  Filled 2023-08-27: qty 1

## 2023-08-27 MED ORDER — DIPHENHYDRAMINE HCL 25 MG PO CAPS
50.0000 mg | ORAL_CAPSULE | Freq: Once | ORAL | Status: AC
  Administered 2023-08-27: 50 mg via ORAL
  Filled 2023-08-27: qty 2

## 2023-08-27 NOTE — ED Provider Notes (Signed)
 [place] EMERGENCY DEPARTMENT AT [facility] Provider Note   CSN: [id number] Arrival date & time: 08/27/23  1511     Patient presents with: Facial Swelling   Darlene Buckley is a 33 y.o. female.   33 year old female presents after allergic reaction to citrus product.  States she ate at lunch and had some facial swelling as well as some tingling inside of her mouth.  She noticed some bumps on her face.  No trouble swallowing.  Was seen in triage and was medicated with Benadryl and prednisone and Pepcid.  During her time while waiting to see me, her symptoms have improved.  She feels back to her baseline.  Patient is scheduled to see an allergist in the near future.       Prior to Admission medications   Medication Sig Start Date End Date Taking? Authorizing Provider  cyclobenzaprine  (FLEXERIL ) 5 MG tablet Take 5 mg by mouth 3 (three) times daily as needed for muscle spasms.    [provider]  ibuprofen  (ADVIL ,MOTRIN ) 200 MG tablet Take 400 mg by mouth every 8 (eight) hours as needed. For pain.    [provider]    Allergies: Patient has no known allergies.    Review of Systems  All other systems reviewed and are negative.   Updated Vital Signs BP 116/82   Pulse 89   Temp 98.2 F (36.8 C)   Resp 18   SpO2 100%   Physical Exam Vitals and nursing note reviewed.  Constitutional:      General: She is not in acute distress.    Appearance: Normal appearance. She is well-developed. She is not toxic-appearing.  HENT:     Head: Normocephalic and atraumatic.   Eyes:     General: Lids are normal.     Conjunctiva/sclera: Conjunctivae normal.     Pupils: Pupils are equal, round, and reactive to light.   Neck:     Thyroid : No thyroid  mass.     Trachea: No tracheal deviation.   Cardiovascular:     Rate and Rhythm: Normal rate and regular rhythm.     Heart sounds: Normal heart sounds. No murmur heard.    No gallop.  Pulmonary:     Effort:  Pulmonary effort is normal. No respiratory distress.     Breath sounds: Normal breath sounds. No stridor. No decreased breath sounds, wheezing, rhonchi or rales.  Abdominal:     General: There is no distension.     Palpations: Abdomen is soft.     Tenderness: There is no abdominal tenderness. There is no rebound.   Musculoskeletal:        General: No tenderness. Normal range of motion.     Cervical back: Normal range of motion and neck supple.   Skin:    General: Skin is warm and dry.     Findings: No abrasion or rash.   Neurological:     Mental Status: She is alert and oriented to person, place, and time. Mental status is at baseline.     GCS: GCS eye subscore is 4. GCS verbal subscore is 5. GCS motor subscore is 6.     Cranial Nerves: No cranial nerve deficit.     Sensory: No sensory deficit.     Motor: Motor function is intact.   Psychiatric:        Attention and Perception: Attention normal.        Speech: Speech normal.        Behavior:  Behavior normal.     (all labs ordered are listed, but only abnormal results are displayed) Labs Reviewed - No data to display  EKG: None  Radiology: No results found.   Procedures   Medications Ordered in the ED  predniSONE (DELTASONE) tablet 60 mg (60 mg Oral Given 08/27/23 1541)  diphenhydrAMINE (BENADRYL) capsule 50 mg (50 mg Oral Given 08/27/23 1541)  famotidine (PEPCID) tablet 20 mg (20 mg Oral Given 08/27/23 1541)                                    Medical Decision Making  Patient is allergic reaction apparently has resolved this point after receiving medications.  Will prescribe epinephrine pen.  Patient to follow-up with her doctor     Final diagnoses:  None    ED Discharge Orders     None          Lind Repine, MD 08/27/23 1702

## 2023-08-27 NOTE — ED Notes (Signed)
 Provided patient with discharge instructions. Pt demonstrated understanding. Denies any questions, comments, or concerns.

## 2023-08-27 NOTE — ED Triage Notes (Signed)
 Pt is coming in for facial swelling that has occurred after eating an orange around 3pm today, she mentions that she has no known fruit allergies but she is supposed to see a allergy specialist this coming week. No airway compromise at this time, and no medications taken PTA. She is otherwise stable at this time.

## 2023-08-27 NOTE — ED Provider Triage Note (Signed)
 Emergency Medicine Provider Triage Evaluation Note  Darlene Buckley , a 33 y.o. female  was evaluated in triage.  Pt complains of allergic reaction. 30 min ago pt ate an orange and immediately notice burning sensation to her mouth, swelling of her lips and bumps appearing on her face.  No lightheadedness, tongue swelling, wheezing or abd cramping.  No hx of allergy.  No other changes  Review of Systems  Positive: As above Negative: As above  Physical Exam  BP 116/82   Pulse 89   Temp 98.2 F (36.8 C)   Resp 18   SpO2 100%  Gen:   Awake, no distress   Resp:  Normal effort  MSK:   Moves extremities without difficulty  Other:    Medical Decision Making  Medically screening exam initiated at 3:37 PM.  Appropriate orders placed.  Darlene Buckley was informed that the remainder of the evaluation will be completed by another provider, this initial triage assessment does not replace that evaluation, and the importance of remaining in the ED until their evaluation is complete.  No obvious angioedema, swelling of face or urticaria..  Lungs clear.  Will give prednisone, benadryl and pepcid.    Debbra Fairy, PA-C 08/27/23 1538

## 2023-09-03 ENCOUNTER — Encounter: Payer: Self-pay | Admitting: Allergy & Immunology

## 2023-09-03 ENCOUNTER — Other Ambulatory Visit: Payer: Self-pay

## 2023-09-03 ENCOUNTER — Ambulatory Visit (INDEPENDENT_AMBULATORY_CARE_PROVIDER_SITE_OTHER): Payer: Self-pay | Admitting: Allergy & Immunology

## 2023-09-03 VITALS — BP 106/88 | HR 75 | Temp 98.1°F | Resp 20 | Ht 60.0 in | Wt 190.2 lb

## 2023-09-03 DIAGNOSIS — H5789 Other specified disorders of eye and adnexa: Secondary | ICD-10-CM

## 2023-09-03 DIAGNOSIS — M791 Myalgia, unspecified site: Secondary | ICD-10-CM | POA: Insufficient documentation

## 2023-09-03 DIAGNOSIS — R42 Dizziness and giddiness: Secondary | ICD-10-CM

## 2023-09-03 DIAGNOSIS — J310 Chronic rhinitis: Secondary | ICD-10-CM | POA: Insufficient documentation

## 2023-09-03 DIAGNOSIS — T783XXD Angioneurotic edema, subsequent encounter: Secondary | ICD-10-CM | POA: Diagnosis not present

## 2023-09-03 DIAGNOSIS — T783XXA Angioneurotic edema, initial encounter: Secondary | ICD-10-CM | POA: Insufficient documentation

## 2023-09-03 DIAGNOSIS — J324 Chronic pansinusitis: Secondary | ICD-10-CM | POA: Diagnosis not present

## 2023-09-03 NOTE — Progress Notes (Signed)
 NEW PATIENT  Date of Service/Encounter:  09/03/23  Consult requested by: Windell Hasty, DO   Assessment:   Chronic rhinitis  Periorbital swelling  Vertigo  Chronic pansinusitis  Angioedema  Myalgia  Plan/Recommendations:   1. Chronic rhinitis and vertigo - Because of insurance stipulations, we cannot do skin testing on the same day as your first visit. - We are all working to fight this, but for now we need to do two separate visits.  - We will know more after we do testing at the next visit.  - The skin testing visit can be squeezed in at your convenience.  - Then we can make a more full plan to address all of your symptoms. - Be sure to stop your antihistamines for 3 days before this appointment.   2. Periorbital swelling - This could be related to your environmental allergies. - So we may know more after the skin testing.   3. Chronic pansinusitis - We will know more after the testing. - I think we can defer on an immune workup at this point in time.   4. Angioedema with some myalgia  - We are going to get some labs to look for autoimmune causes of swelling. - This is particularly concerning with the history of aching to go with it.  - We are also going to get some labs to look for other weird causes of swelling.  - Hopefully this all comes back negative.  - We may consider doing patch testing in case this is a manifestation of contact dermatitis (allergy to chemicals or metals or something like that).  5. Return in about 1 week (around 09/10/2023) for ALLERGY TESTING (1-68 + orange). You can have the follow up appointment with Dr. Idolina Maker or a Nurse Practicioner (our Nurse Practitioners are excellent and always have Physician oversight!).   This note in its entirety was forwarded to the Provider who requested this consultation.  Subjective:   Darlene Buckley is a 33 y.o. female presenting today for evaluation of  Chief Complaint  Patient presents with    Establish Care    Ulcers inside of lip    Angioedema    Eye and face swelling    Sinusitis    States server sinus infection .   Allergic Reaction    Oranges cause burning mouth lips swelling and rash   Nasal Congestion    Itchy nose at work    [employer] has a history of the following: Patient Active Problem List   Diagnosis Date Noted   Chronic rhinitis 09/03/2023   Chronic pansinusitis 09/03/2023   Angio-edema 09/03/2023   Myalgia 09/03/2023    History obtained from: chart review and patient.  Discussed the use of AI scribe software for clinical note transcription with the patient and/or guardian, who gave verbal consent to proceed.  Darlene Buckley was referred by Windell Hasty, DO.     Darlene Buckley is a 33 y.o. female presenting for an evaluation of swelling and allergies.  Allergic Rhinitis Symptom History: Symptoms began with the onset of the pollen season, leading to a severe sinus infection characterized by intense facial pain, particularly on the left side, dizziness, and vertigo. These symptoms have been debilitating, preventing her from working. She also experienced a mouth ulcer and could taste the infection through nasal drainage. Initial treatment with amoxicillin, prednisone , and Flavings was ineffective, with amoxicillin causing significant side effects such as fatigue and frequent bathroom use, leading her to discontinue the  medications.  She has a history of vertigo, which she describes as feeling 'like getting up and feeling like you're drunk.' This episode of vertigo was particularly severe, coinciding with her sinus infection, and has been a recurring issue in the past.  Food Allergy Symptom History: She recalls a previous incident of visual disturbances after eating a biscuit, described as 'like I had looked at the sun for twenty minutes.' This was a one-time occurrence and has not recurred. She has experienced hives and itching after exposure to certain foods,  such as chicken and oranges, but has not had severe reactions. Working in [employer], she suspects that exposure to certain foods may trigger her symptoms. No fever, cough, or other infections like pneumonia or skin infections.   Skin Symptom History: She reports swelling and itching at the top of her hip, which she noticed after stopping certain aftercare products. The swelling was initially red and achy but has since decreased. There is no known autoimmune history in her family, and she has not had similar swelling episodes before. She has no known allergies to metals or cosmetics.  Otherwise, there is no history of other atopic diseases, including drug allergies, stinging insect allergies, or contact dermatitis. There is no significant infectious history. Vaccinations are up to date.    Past Medical History: Patient Active Problem List   Diagnosis Date Noted   Chronic rhinitis 09/03/2023   Chronic pansinusitis 09/03/2023   Angio-edema 09/03/2023   Myalgia 09/03/2023    Medication List:  Allergies as of 09/03/2023   No Known Allergies      Medication List        Accurate as of September 03, 2023 12:00 PM. If you have any questions, ask your nurse or doctor.          cyclobenzaprine  5 MG tablet Commonly known as: FLEXERIL  Take 5 mg by mouth 3 (three) times daily as needed for muscle spasms.   diphenhydrAMINE  25 mg capsule Commonly known as: BENADRYL  Take 25 mg by mouth.   EPINEPHrine  0.3 mg/0.3 mL Soaj injection Commonly known as: EPI-PEN Inject 0.3 mg into the muscle as needed for anaphylaxis.   fluticasone 50 MCG/ACT nasal spray Commonly known as: FLONASE 1 spray in each nostril Nasally Twice a day for 30 days   ibuprofen  200 MG tablet Commonly known as: ADVIL  Take 400 mg by mouth every 8 (eight) hours as needed. For pain.        Birth History: non-contributory  Developmental History: non-contributory  Past Surgical History: History reviewed. No  pertinent surgical history.   Family History: Family History  Problem Relation Age of Onset   Allergic rhinitis Mother    Allergic rhinitis Father    Allergic rhinitis Sister    Allergic rhinitis Brother      Social History: Darlene Buckley lives at home with her family. She lives in an apartment that is 32 years old. There is hardwood throughout the home. There is gas heating and central cooling. There are no dust mite coverings on her bedding. There are no fume, chemical, or dust exposures. 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. She is exposed to fumes, chemicals, or dust at her workplace. There is a HEPA filter in the home. [place] or [place].    Review of systems otherwise negative other than that mentioned in the HPI.    Objective:   Blood pressure 106/88, pulse 75, temperature  98.1 F (36.7 C), resp. rate 20, height 5' (1.524 m), weight 190 lb 3.2 oz (86.3 kg), SpO2 98%. Body mass index is 37.15 kg/m.     Physical Exam Vitals reviewed.  Constitutional:      Appearance: She is well-developed.  HENT:     Head: Normocephalic and atraumatic.     Right Ear: Tympanic membrane, ear canal and external ear normal. No drainage, swelling or tenderness. Tympanic membrane is not injected, scarred, erythematous, retracted or bulging.     Left Ear: Tympanic membrane, ear canal and external ear normal. No drainage, swelling or tenderness. Tympanic membrane is not injected, scarred, erythematous, retracted or bulging.     Nose: No nasal deformity, septal deviation, mucosal edema or rhinorrhea.     Right Turbinates: Enlarged, swollen and pale.     Left Turbinates: Enlarged, swollen and pale.     Right Sinus: No maxillary sinus tenderness or frontal sinus tenderness.     Left Sinus: No maxillary sinus tenderness or frontal sinus tenderness.     Mouth/Throat:     Mouth: Mucous membranes are not pale and not dry.      Pharynx: Uvula midline.   Eyes:     General:        Right eye: No discharge.        Left eye: No discharge.     Conjunctiva/sclera: Conjunctivae normal.     Right eye: Right conjunctiva is not injected. No chemosis.    Left eye: Left conjunctiva is not injected. No chemosis.    Pupils: Pupils are equal, round, and reactive to light.    Cardiovascular:     Rate and Rhythm: Normal rate and regular rhythm.     Heart sounds: Normal heart sounds.  Pulmonary:     Effort: Pulmonary effort is normal. No tachypnea, accessory muscle usage or respiratory distress.     Breath sounds: Normal breath sounds. No wheezing, rhonchi or rales.     Comments: Moving air well in all lung fields. No increased work of breathing noted.  Chest:     Chest wall: No tenderness.  Abdominal:     Tenderness: There is no abdominal tenderness. There is no guarding or rebound.  Lymphadenopathy:     Head:     Right side of head: No submandibular, tonsillar or occipital adenopathy.     Left side of head: No submandibular, tonsillar or occipital adenopathy.     Cervical: No cervical adenopathy.   Skin:    Coloration: Skin is not pale.     Findings: No abrasion, erythema, petechiae or rash. Rash is not papular, urticarial or vesicular.   Neurological:     Mental Status: She is alert.   Psychiatric:        Behavior: Behavior is cooperative.      Diagnostic studies: labs sent instead          Drexel Gentles, MD [facility] 

## 2023-09-03 NOTE — Patient Instructions (Addendum)
 1. Chronic rhinitis and vertigo - Because of insurance stipulations, we cannot do skin testing on the same day as your first visit. - We are all working to fight this, but for now we need to do two separate visits.  - We will know more after we do testing at the next visit.  - The skin testing visit can be squeezed in at your convenience.  - Then we can make a more full plan to address all of your symptoms. - Be sure to stop your antihistamines for 3 days before this appointment.   2. Periorbital swelling - This could be related to your environmental allergies. - So we may know more after the skin testing.   3. Chronic pansinusitis - We will know more after the testing. - I think we can defer on an immune workup at this point in time.   4. Angioedema with some myalgia  - We are going to get some labs to look for autoimmune causes of swelling. - This is particularly concerning with the history of aching to go with it.  - We are also going to get some labs to look for other weird causes of swelling.  - Hopefully this all comes back negative.  - We may consider doing patch testing in case this is a manifestation of contact dermatitis (allergy to chemicals or metals or something like that).  5. Return in about 1 week (around 09/10/2023) for ALLERGY TESTING (1-68 + orange). You can have the follow up appointment with Dr. Idolina Maker or a Nurse Practicioner (our Nurse Practitioners are excellent and always have Physician oversight!).    Please inform us  of any Emergency Department visits, hospitalizations, or changes in symptoms. Call us  before going to the ED for breathing or allergy symptoms since we might be able to fit you in for a sick visit. Feel free to contact us  anytime with any questions, problems, or concerns.  It was a pleasure to meet you today!  Websites that have reliable patient information: 1. American Academy of Asthma, Allergy, and Immunology: www.aaaai.org 2. Food Allergy  Research and Education (FARE): foodallergy.org 3. Mothers of Asthmatics: http://www.asthmacommunitynetwork.org 4. American College of Allergy, Asthma, and Immunology: www.acaai.org      "Like" us  on Facebook and Instagram for our latest updates!      A healthy democracy works best when [employer] participate! Make sure you are registered to vote! If you have moved or changed any of your contact information, you will need to get this updated before voting! Scan the QR codes below to learn more!

## 2023-09-08 ENCOUNTER — Ambulatory Visit (INDEPENDENT_AMBULATORY_CARE_PROVIDER_SITE_OTHER): Admitting: Allergy & Immunology

## 2023-09-08 DIAGNOSIS — J301 Allergic rhinitis due to pollen: Secondary | ICD-10-CM

## 2023-09-08 NOTE — Patient Instructions (Addendum)
 h1. Chronic rhinitis and vertigo - Testing today showed: trees (but this was pretty darn small)  - Copy of test results provided.  - Avoidance measures provided. - Continue with: Flonase (fluticasone) one spray per nostril daily (AIM FOR EAR ON EACH SIDE) - Start taking: Zyrtec (cetirizine) 10mg  tablet once daily as needed - You can use an extra dose of the antihistamine, if needed, for breakthrough symptoms.  - Consider nasal saline rinses 1-2 times daily to remove allergens from the nasal cavities as well as help with mucous clearance (this is especially helpful to do before the nasal sprays are given)  2. Periorbital swelling - Trees were the only positive thing on skin testing, but that should only lead to problems in February through April or so.  - Thus far all of the workup for hereditary angioedema has been normal. - Testing to the most common foods was negative as well.   3. Angioedema with some myalgia  - Everything looks normal thus far.  - Continue to note triggers and episodes to try to help us  figure out what is going on.   5. Return in about 6 months (around 03/09/2024). You can have the follow up appointment with Dr. Iva or a Nurse Practicioner (our Nurse Practitioners are excellent and always have Physician oversight!).    Please inform us  of any Emergency Department visits, hospitalizations, or changes in symptoms. Call us  before going to the ED for breathing or allergy symptoms since we might be able to fit you in for a sick visit. Feel free to contact us  anytime with any questions, problems, or concerns.  It was a pleasure to meet you today!  Websites that have reliable patient information: 1. American Academy of Asthma, Allergy, and Immunology: www.aaaai.org 2. Food Allergy Research and Education (FARE): foodallergy.org 3. Mothers of Asthmatics: http://www.asthmacommunitynetwork.org 4. American College of Allergy, Asthma, and Immunology:  www.acaai.org      "Like" us  on Facebook and Instagram for our latest updates!      A healthy democracy works best when [employer] participate! Make sure you are registered to vote! If you have moved or changed any of your contact information, you will need to get this updated before voting! Scan the QR codes below to learn more!          Airborne Adult Perc - 09/08/23 1700     Time Antigen Placed 1733    Allergen Manufacturer Jestine    Location Back    Number of Test 55    Panel 1 Select    1. Control-Buffer 50% Glycerol Negative    2. Control-Histamine 2+    3. Bahia Negative    4. French Southern Territories Negative    5. Johnson Negative    6. Kentucky  Blue Negative    7. Meadow Fescue Negative    8. Perennial Rye Negative    9. Timothy Negative    10. Ragweed Mix Negative    11. Cocklebur Negative    12. Plantain,  English Negative    13. Baccharis Negative    14. Dog Fennel Negative    15. Russian Thistle Negative    16. Lamb's Quarters Negative    17. Sheep Sorrell Negative    18. Rough Pigweed Negative    19. Marsh Elder, Rough Negative    20. Mugwort, Common Negative    21. Box, Elder Negative    22. Cedar, red Negative    23. Sweet Gum Negative    24. Pecan Pollen Negative  25. Pine Mix Negative    26. Walnut, Black Pollen Negative    27. Red Mulberry Negative    28. Ash Mix Negative    29. Birch Mix Negative    30. Beech American Negative    31. Cottonwood, Guinea-Bissau Negative    32. Hickory, White Negative    33. Maple Mix Negative    34. Oak, Guinea[place] Mix Negative    35. Sycamore Eastern Negative    36. Alternaria Alternata Negative    37. Cladosporium Herbarum Negative    38. Aspergillus Mix Negative    39. Penicillium Mix Negative    40. Bipolaris Sorokiniana (Helminthosporium) Negative    41. Drechslera Spicifera (Curvularia) Negative    42. Mucor Plumbeus Negative    43. Fusarium Moniliforme Negative    44. Aureobasidium Pullulans (pullulara) Negative     45. Rhizopus Oryzae Negative    46. Botrytis Cinera Negative    47. Epicoccum Nigrum Negative    48. Phoma Betae Negative    49. Dust Mite Mix Negative    50. Cat Hair 10,000 BAU/ml Negative    51.  Dog Epithelia Negative    52. Mixed Feathers Negative    53. Horse Epithelia Negative    54. Cockroach, German Negative    55. Tobacco Leaf Negative          13 Food Perc - 09/08/23 1700       Test Information   Time Antigen Placed 1733    Allergen Manufacturer Jestine    Location Back    Number of allergen test 13      Food   1. Peanut Negative    2. Soybean Negative    3. Wheat Negative    4. Sesame Negative    5. Milk, Cow Negative    6. Casein Negative    7. Egg White, Chicken Negative    8. Shellfish Mix Negative    9. Fish Mix Negative    10. Cashew Negative    11. Walnut Food Negative    12. Almond Negative    13. Hazelnut Negative          Intradermal - 09/08/23 1816     Time Antigen Placed 0615    Allergen Manufacturer Jestine    Location Back    Number of Test 16    Control Negative    Bahia Negative    French Southern Territories Negative    Johnson Negative    7 Grass Negative    Ragweed Mix Negative    Weed Mix Negative    Tree Mix 1+    Mold 1 Negative    Mold 2 Negative    Mold 3 Negative    Mold 4 Negative    Mite Mix Negative    Cat Negative    Dog Negative    Cockroach Negative          Reducing Pollen Exposure  The American Academy of Allergy, Asthma and Immunology suggests the following steps to reduce your exposure to pollen during allergy seasons.    Do not hang sheets or clothing out to dry; pollen may collect on these items. Do not mow lawns or spend time around freshly cut grass; mowing stirs up pollen. Keep windows closed at night.  Keep car windows closed while driving. Minimize morning activities outdoors, a time when pollen counts are usually at their highest. Stay indoors as much as possible when pollen counts or humidity is high and on  windy days  when pollen tends to remain in the air longer. Use air conditioning when possible.  Many air conditioners have filters that trap the pollen spores. Use a HEPA room air filter to remove pollen form the indoor air you breathe.

## 2023-09-08 NOTE — Progress Notes (Unsigned)
 FOLLOW UP  Date of Service/Encounter:  09/08/23   Assessment:   Chronic rhinitis   Periorbital swelling   Vertigo   Chronic pansinusitis   Angioedema   Myalgia  Plan/Recommendations:   Patient Instructions  1. Chronic rhinitis and vertigo - Testing today showed: trees (but this was pretty darn small)  - Copy of test results provided.  - Avoidance measures provided. - Continue with: Flonase (fluticasone) one spray per nostril daily (AIM FOR EAR ON EACH SIDE) - Start taking: Zyrtec (cetirizine) 10mg  tablet once daily as needed - You can use an extra dose of the antihistamine, if needed, for breakthrough symptoms.  - Consider nasal saline rinses 1-2 times daily to remove allergens from the nasal cavities as well as help with mucous clearance (this is especially helpful to do before the nasal sprays are given)  2. Periorbital swelling - Trees were the only positive thing on skin testing, but that should only lead to problems in February through April or so.  - Thus far all of the workup for hereditary angioedema has been normal. - Testing to the most common foods was negative as well.   3. Angioedema with some myalgia  - Everything looks normal thus far.  - Continue to note triggers and episodes to try to help us  figure out what is going on.   5. Return in about 6 months (around 03/09/2024). You can have the follow up appointment with Dr. Iva or a Nurse Practicioner (our Nurse Practitioners are excellent and always have Physician oversight!).    Please inform us  of any Emergency Department visits, hospitalizations, or changes in symptoms. Call us  before going to the ED for breathing or allergy symptoms since we might be able to fit you in for a sick visit. Feel free to contact us  anytime with any questions, problems, or concerns.  It was a pleasure to meet you today!  Websites that have reliable patient information: 1. American Academy of Asthma, Allergy, and  Immunology: www.aaaai.org 2. Food Allergy Research and Education (FARE): foodallergy.org 3. Mothers of Asthmatics: http://www.asthmacommunitynetwork.org 4. American College of Allergy, Asthma, and Immunology: www.acaai.org      "Like" us  on Facebook and Instagram for our latest updates!      A healthy democracy works best when [employer] participate! Make sure you are registered to vote! If you have moved or changed any of your contact information, you will need to get this updated before voting! Scan the QR codes below to learn more!          Airborne Adult Perc - 09/08/23 1700     Time Antigen Placed 1733    Allergen Manufacturer Jestine    Location Back    Number of Test 55    Panel 1 Select    1. Control-Buffer 50% Glycerol Negative    2. Control-Histamine 2+    3. Bahia Negative    4. [place] Territ[place] Negative    5. Johnson Negative    6. [place]  Blue Negative    7. Meadow Fescue Negative    8. Perennial Rye Negative    9. Timothy Negative    10. Ragweed Mix Negative    11. Cocklebur Negative    12. Plantain,  English Negative    13. Baccharis Negative    14. Dog Fennel Negative    15. Russian Thistle Negative    16. Lamb's Quarters Negative    17. Sheep Sorrell Negative    18. Rough Pigweed Negative  19. Marsh Elder, Rough Negative    20. Mugwort, Common Negative    21. Box, Elder Negative    22. Cedar, red Negative    23. Sweet Gum Negative    24. Pecan Pollen Negative    25. Pine Mix Negative    26. Walnut, Black Pollen Negative    27. Red Mulberry Negative    28. Ash Mix Negative    29. Birch Mix Negative    30. Beech American Negative    31. Cottonwood, Guinea-[place]issau Negative    32. Hickory, White Negative    33. Maple Mix Negative    34. Oak, [place] Mix Negative    35. Sycamore Eastern Negative    36. Alternaria Alternata Negative    37. Cladosporium Herbarum Negative    38. Aspergillus Mix Negative    39. Penicillium Mix Negative    40. Bipolaris  Sorokiniana (Helminthosporium) Negative    41. Drechslera Spicifera (Curvularia) Negative    42. Mucor Plumbeus Negative    43. Fusarium Moniliforme Negative    44. Aureobasidium Pullulans (pullulara) Negative    45. Rhizopus Oryzae Negative    46. Botrytis Cinera Negative    47. Epicoccum Nigrum Negative    48. Phoma Betae Negative    49. Dust Mite Mix Negative    50. Cat Hair 10,000 BAU/ml Negative    51.  Dog Epithelia Negative    52. Mixed Feathers Negative    53. Horse Epithelia Negative    54. Cockroach, German Negative    55. Tobacco Leaf Negative          13 Food Perc - 09/08/23 1700       Test Information   Time Antigen Placed 1733    Allergen Manufacturer Jestine    Location Back    Number of allergen test 13      Food   1. Peanut Negative    2. Soybean Negative    3. Wheat Negative    4. Sesame Negative    5. Milk, Cow Negative    6. Casein Negative    7. Egg White, Chicken Negative    8. Shellfish Mix Negative    9. Fish Mix Negative    10. Cashew Negative    11. Walnut Food Negative    12. Almond Negative    13. Hazelnut Negative          Intradermal - 09/08/23 1816     Time Antigen Placed 0615    Allergen Manufacturer Jestine    Location Back    Number of Test 16    Control Negative    Bahia Negative    [place] Territ[place] Negative    Johnson Negative    7 Grass Negative    Ragweed Mix Negative    Weed Mix Negative    Tree Mix 1+    Mold 1 Negative    Mold 2 Negative    Mold 3 Negative    Mold 4 Negative    Mite Mix Negative    Cat Negative    Dog Negative    Cockroach Negative          Reducing Pollen Exposure  The American Academy of Allergy, Asthma and Immunology suggests the following steps to reduce your exposure to pollen during allergy seasons.    Do not hang sheets or clothing out to dry; pollen may collect on these items. Do not mow lawns or spend time around freshly cut grass; mowing stirs up pollen. Keep  windows closed at night.   Keep car windows closed while driving. Minimize morning activities outdoors, a time when pollen counts are usually at their highest. Stay indoors as much as possible when pollen counts or humidity is high and on windy days when pollen tends to remain in the air longer. Use air conditioning when possible.  Many air conditioners have filters that trap the pollen spores. Use a HEPA room air filter to remove pollen form the indoor air you breathe.      Subjective:   Darlene Buckley is a 33 y.o. female presenting today for follow up of No chief complaint on file.   Darlene Buckley has a history of the following: Patient Active Problem List   Diagnosis Date Noted   Chronic rhinitis 09/03/2023   Chronic pansinusitis 09/03/2023   Angio-edema 09/03/2023   Myalgia 09/03/2023    History obtained from: chart review and patient.  Discussed the use of AI scribe software for clinical note transcription with the patient and/or guardian, who gave verbal consent to proceed.  Darlene Buckley is a 33 y.o. female presenting for skin testing. She was last seen on June 19th. We could not do testing because her insurance company does not cover testing on the same day as a New Patient visit. She has been off of all antihistamines 3 days in anticipation of the testing.   She was last seen in June 2025.  At that time, she presented with periorbital swelling as well as chronic rhinitis and vertigo.  We decided to do environmental allergy testing to see if there was something environmental contributing to her symptoms.  We decided to do the most common foods as well.  For her angioedema, she had already had some autoimmune work performed.  We obtained some additional labs, including an ANA as well as inflammatory markers, tryptase, and complement activity as well as see 1 esterase inhibitor level and function.  These were all negative.  Otherwise, there have been no changes to her past medical history, surgical history,  family history, or social history.    Review of systems otherwise negative other than that mentioned in the HPI.    Objective:   There were no vitals taken for this visit. There is no height or weight on file to calculate BMI.    Physical exam deferred since this was a skin testing appointment only.   Diagnostic studies: {Blank single:19197::none,deferred due to recent antihistamine use,labs sent instead, }  Spirometry: {Blank single:19197::results normal (FEV1: ***%, FVC: ***%, FEV1/FVC: ***%),results abnormal (FEV1: ***%, FVC: ***%, FEV1/FVC: ***%)}.    {Blank single:19197::Spirometry consistent with mild obstructive disease,Spirometry consistent with moderate obstructive disease,Spirometry consistent with severe obstructive disease,Spirometry consistent with possible restrictive disease,Spirometry consistent with mixed obstructive and restrictive disease,Spirometry uninterpretable due to technique,Spirometry consistent with normal pattern}. {Blank single:19197::Albuterol/Atrovent nebulizer,Xopenex/Atrovent nebulizer,Albuterol nebulizer,Albuterol four puffs via MDI,Xopenex four puffs via MDI} treatment given in clinic with {Blank single:19197::significant improvement in FEV1 per ATS criteria,significant improvement in FVC per ATS criteria,significant improvement in FEV1 and FVC per ATS criteria,improvement in FEV1, but not significant per ATS criteria,improvement in FVC, but not significant per ATS criteria,improvement in FEV1 and FVC, but not significant per ATS criteria,no improvement}.  Allergy Studies: {Blank single:19197::none,labs sent instead, }   Airborne Adult Perc - 09/08/23 1700     Time Antigen Placed 1733    Allergen Manufacturer Jestine    Location Back    Number of Test 55    Panel 1 Select    1.  Control-Buffer 50% Glycerol Negative    2. Control-Histamine 2+    3. Bahia Negative    4. [place] Negative    5.  Johnson Negative    6. [place]  Blue Negative    7. Meadow Fescue Negative    8. Perennial Rye Negative    9. Timothy Negative    10. Ragweed Mix Negative    11. Cocklebur Negative    12. Plantain,  English Negative    13. Baccharis Negative    14. Dog Fennel Negative    15. Russian Thistle Negative    16. Lamb's Quarters Negative    17. Sheep Sorrell Negative    18. Rough Pigweed Negative    19. Marsh Elder, Rough Negative    20. Mugwort, Common Negative    21. Box, Elder Negative    22. Cedar, red Negative    23. Sweet Gum Negative    24. Pecan Pollen Negative    25. Pine Mix Negative    26. Walnut, Black Pollen Negative    27. Red Mulberry Negative    28. Ash Mix Negative    29. Birch Mix Negative    30. Beech American Negative    31. Cottonwood, Guinea-[place]iss[place] Negative    32. Hickory, White Negative    33. Maple Mix Negative    34. Oak, [place] Mix Negative    35. Sycamore Eastern Negative    36. Alternaria Alternata Negative    37. Cladosporium Herbarum Negative    38. Aspergillus Mix Negative    39. Penicillium Mix Negative    40. Bipolaris Sorokiniana (Helminthosporium) Negative    41. Drechslera Spicifera (Curvularia) Negative    42. Mucor Plumbeus Negative    43. Fusarium Moniliforme Negative    44. Aureobasidium Pullulans (pullulara) Negative    45. Rhizopus Oryzae Negative    46. Botrytis Cinera Negative    47. Epicoccum Nigrum Negative    48. Phoma Betae Negative    49. Dust Mite Mix Negative    50. Cat Hair 10,000 BAU/ml Negative    51.  Dog Epithelia Negative    52. Mixed Feathers Negative    53. Horse Epithelia Negative    54. Cockroach, German Negative    55. Tobacco Leaf Negative          13 Food Perc - 09/08/23 1700       Test Information   Time Antigen Placed 1733    Allergen Manufacturer Jestine    Location Back    Number of allergen test 13      Food   1. Peanut Negative    2. Soybean Negative    3. Wheat Negative    4. Sesame Negative     5. Milk, Cow Negative    6. Casein Negative    7. Egg White, Chicken Negative    8. Shellfish Mix Negative    9. Fish Mix Negative    10. Cashew Negative    11. Walnut Food Negative    12. Almond Negative    13. Hazelnut Negative          Intradermal - 09/08/23 1816     Time Antigen Placed 9384    Allergen Manufacturer Jestine    Location Back    Number of Test 16    Control Negative    Bahia Negative    [place] Territ[place] Negative    Johnson Negative    7 Grass Negative    Ragweed Mix Negative    Nilda  Mix Negative    Tree Mix 1+    Mold 1 Negative    Mold 2 Negative    Mold 3 Negative    Mold 4 Negative    Mite Mix Negative    Cat Negative    Dog Negative    Cockroach Negative          {Blank single:19197::Allergy testing results were read and interpreted by myself, documented by clinical staff., }      Marty Shaggy, MD  [facility] 

## 2023-09-09 ENCOUNTER — Encounter: Payer: Self-pay | Admitting: Allergy & Immunology

## 2023-09-09 ENCOUNTER — Ambulatory Visit: Payer: Self-pay | Admitting: Allergy & Immunology

## 2023-09-09 LAB — C1 ESTERASE INHIBITOR: C1INH SerPl-mCnc: 36 mg/dL (ref 21–39)

## 2023-09-09 LAB — COMPLEMENT COMPONENT C1Q: Complement C1Q: 11.9 mg/dL (ref 10.3–20.5)

## 2023-09-09 LAB — C-REACTIVE PROTEIN: CRP: 8 mg/L (ref 0–10)

## 2023-09-09 LAB — ANTINUCLEAR ANTIBODIES, IFA

## 2023-09-09 LAB — C1 ESTERASE INHIBITOR, FUNCTIONAL

## 2023-09-09 LAB — TRYPTASE: Tryptase: 3.8 ug/L (ref 2.2–13.2)

## 2023-09-09 LAB — SEDIMENTATION RATE: Sed Rate: 25 mm/h (ref 0–32)

## 2023-09-09 LAB — RHEUMATOID FACTOR: Rheumatoid fact SerPl-aCnc: <10 [IU]/mL (ref <14.0)

## 2023-09-09 LAB — C3 AND C4
Complement C3, Serum: 146 mg/dL (ref 82–167)
Complement C4, Serum: 30 mg/dL (ref 12–38)

## 2023-09-10 DIAGNOSIS — F4010 Social phobia, unspecified: Secondary | ICD-10-CM | POA: Diagnosis not present

## 2023-09-17 DIAGNOSIS — F4010 Social phobia, unspecified: Secondary | ICD-10-CM | POA: Diagnosis not present

## 2023-09-24 DIAGNOSIS — F4010 Social phobia, unspecified: Secondary | ICD-10-CM | POA: Diagnosis not present

## 2023-10-01 DIAGNOSIS — F4010 Social phobia, unspecified: Secondary | ICD-10-CM | POA: Diagnosis not present

## 2023-10-08 DIAGNOSIS — F4010 Social phobia, unspecified: Secondary | ICD-10-CM | POA: Diagnosis not present

## 2023-10-22 DIAGNOSIS — F4010 Social phobia, unspecified: Secondary | ICD-10-CM | POA: Diagnosis not present

## 2023-11-12 DIAGNOSIS — F4010 Social phobia, unspecified: Secondary | ICD-10-CM | POA: Diagnosis not present

## 2023-11-26 DIAGNOSIS — F4010 Social phobia, unspecified: Secondary | ICD-10-CM | POA: Diagnosis not present

## 2023-11-30 DIAGNOSIS — E66811 Obesity, class 1: Secondary | ICD-10-CM | POA: Diagnosis not present

## 2023-12-03 DIAGNOSIS — F4010 Social phobia, unspecified: Secondary | ICD-10-CM | POA: Diagnosis not present

## 2023-12-07 ENCOUNTER — Ambulatory Visit (INDEPENDENT_AMBULATORY_CARE_PROVIDER_SITE_OTHER)

## 2023-12-07 ENCOUNTER — Other Ambulatory Visit: Payer: Self-pay

## 2023-12-07 DIAGNOSIS — M216X1 Other acquired deformities of right foot: Secondary | ICD-10-CM | POA: Diagnosis not present

## 2023-12-07 DIAGNOSIS — M7662 Achilles tendinitis, left leg: Secondary | ICD-10-CM

## 2023-12-07 DIAGNOSIS — M216X2 Other acquired deformities of left foot: Secondary | ICD-10-CM | POA: Diagnosis not present

## 2023-12-07 DIAGNOSIS — M7661 Achilles tendinitis, right leg: Secondary | ICD-10-CM

## 2023-12-07 DIAGNOSIS — M2141 Flat foot [pes planus] (acquired), right foot: Secondary | ICD-10-CM | POA: Diagnosis not present

## 2023-12-07 DIAGNOSIS — M722 Plantar fascial fibromatosis: Secondary | ICD-10-CM

## 2023-12-07 DIAGNOSIS — M2142 Flat foot [pes planus] (acquired), left foot: Secondary | ICD-10-CM | POA: Diagnosis not present

## 2023-12-07 MED ORDER — METHYLPREDNISOLONE 4 MG PO TBPK: ORAL_TABLET | ORAL | 0 refills | Status: AC

## 2023-12-07 NOTE — Patient Instructions (Signed)

## 2023-12-07 NOTE — Progress Notes (Signed)
 Subjective:  Patient ID: Darlene Buckley, female    DOB: 11-28-1990,  MRN: [id number]  Chief Complaint  Patient presents with   Foot Pain    Pt stated that she has been having pain in both her feet for a few years now she stated that the pain started out in the ball of her foot but then it has moved to different parts of her foot and going up her leg     33 y.o. female presents with the above complaint.  She relates to pain in both the inferior and posterior aspect of her bilateral heels.  She states that both extremities hurt equally.  She relates that the pain is most significant at her first step in the morning or after sitting for an extended period of time.  She denies any trauma to the area.  Review of Systems: Negative except as noted in the HPI. Denies N/V/F/Ch.  Past Medical History:  Diagnosis Date   Chlamydia     Current Outpatient Medications:    cyclobenzaprine  (FLEXERIL ) 5 MG tablet, Take 5 mg by mouth 3 (three) times daily as needed for muscle spasms. (Patient not taking: Reported on 09/03/2023), Disp: , Rfl:    diphenhydrAMINE  (BENADRYL ) 25 mg capsule, Take 25 mg by mouth., Disp: , Rfl:    EPINEPHrine  0.3 mg/0.3 mL IJ SOAJ injection, Inject 0.3 mg into the muscle as needed for anaphylaxis., Disp: 1 each, Rfl: 0   fluticasone (FLONASE) 50 MCG/ACT nasal spray, 1 spray in each nostril Nasally Twice a day for 30 days, Disp: , Rfl:    ibuprofen  (ADVIL ,MOTRIN ) 200 MG tablet, Take 400 mg by mouth every 8 (eight) hours as needed. For pain., Disp: , Rfl:   Social History   Tobacco Use  Smoking Status Never   Passive exposure: Past  Smokeless Tobacco Never    No Known Allergies Objective:  There were no vitals filed for this visit. There is no height or weight on file to calculate BMI. Constitutional Well developed. Well nourished. Oriented to person, place, and time.  Vascular Dorsalis pedis pulses palpable bilaterally. Posterior tibial pulses palpable  bilaterally. Capillary refill normal to all digits.  No cyanosis or clubbing noted. Pedal hair growth normal.  Neurologic Normal speech. Epicritic sensation to light touch grossly present bilaterally. Negative tinel sign at tarsal tunnel bilaterally.   Dermatologic Skin texture and turgor are within normal limits.  No open wounds. No skin lesions.  Musculoskeletal: Normal HF and 1st MTP ROM without pain or crepitus bilaterally. Mild flexible flatfoot. Tender to palpation at the calcaneal tuber bilaterally. Tender to palpation at insertion of achilles tendon bilaterally. 5/5 muscle strength to achilles tendon bilaterally.  No pain with calcaneal squeeze bilaterally. No focal pain to palpation of haglund's prominence bilaterally.  Ankle ROM diminished range of motion bilaterally with knee extended. Silfverskiold Test: positive bilaterally.   Radiographs: 3WB views bilaterally were taken and reviewed. No acute fractures or dislocations. No evidence of stress fracture.  Plantar heel spur absent. Posterior heel spur present bilaterally.  Decreased medial longitudinal arch without any arthritic development.  Right worse than left.  Assessment:   1. Plantar fasciitis, bilateral   2. Acquired equinus deformity of both feet   3. Pes planus of both feet   4. Achilles tendonitis, bilateral    Plan:  Patient was evaluated and treated and all questions answered.  Plantar Fasciitis, Achilles tendonitis, Flexible pes planus bilaterally - XR reviewed as above.  - Educated on icing and stretching.  Instructions given.  - Discussed supportive shoegear and use of OTC insoles. Discussed that if insoles are effective, patient would benefit from custom molded insoles to better match foot shape. To be discussed t next visit.  - Discussed treatment options to reduce inflammation in plantar fascia. Risks and benefits discussed. The patient would like to proceed with tapered methylprenisolone dose pak to last  6 days.  This will decrease inflammation bilaterally and allow her to stretch the area out well. We discussed risks and benefits of this treatment.      Return in about 4 weeks (around 01/04/2024).  Prentice Ovens, DPM AACFAS Fellowship Trained Podiatric Surgeon [facility]

## 2023-12-17 DIAGNOSIS — F4010 Social phobia, unspecified: Secondary | ICD-10-CM | POA: Diagnosis not present

## 2023-12-31 DIAGNOSIS — F4010 Social phobia, unspecified: Secondary | ICD-10-CM | POA: Diagnosis not present

## 2024-01-07 DIAGNOSIS — F4010 Social phobia, unspecified: Secondary | ICD-10-CM | POA: Diagnosis not present

## 2024-01-14 DIAGNOSIS — F4010 Social phobia, unspecified: Secondary | ICD-10-CM | POA: Diagnosis not present

## 2024-01-21 DIAGNOSIS — F4010 Social phobia, unspecified: Secondary | ICD-10-CM | POA: Diagnosis not present

## 2024-01-28 DIAGNOSIS — F4010 Social phobia, unspecified: Secondary | ICD-10-CM | POA: Diagnosis not present

## 2024-02-18 DIAGNOSIS — F4010 Social phobia, unspecified: Secondary | ICD-10-CM | POA: Diagnosis not present

## 2024-02-25 DIAGNOSIS — F4010 Social phobia, unspecified: Secondary | ICD-10-CM | POA: Diagnosis not present
